# Patient Record
Sex: Female | Born: 1950 | Race: White | Hispanic: No | Marital: Married | Smoking: Never smoker
Health system: Southern US, Community
[De-identification: ages and names within clinical notes are randomized; demographics above are authoritative.]

## PROBLEM LIST (undated history)

## (undated) DIAGNOSIS — F32A Depression, unspecified: Secondary | ICD-10-CM

## (undated) DIAGNOSIS — K449 Diaphragmatic hernia without obstruction or gangrene: Secondary | ICD-10-CM

## (undated) DIAGNOSIS — M722 Plantar fascial fibromatosis: Secondary | ICD-10-CM

## (undated) DIAGNOSIS — R519 Headache, unspecified: Secondary | ICD-10-CM

## (undated) DIAGNOSIS — D131 Benign neoplasm of stomach: Secondary | ICD-10-CM

## (undated) DIAGNOSIS — R51 Headache: Secondary | ICD-10-CM

## (undated) DIAGNOSIS — M419 Scoliosis, unspecified: Secondary | ICD-10-CM

## (undated) DIAGNOSIS — G4700 Insomnia, unspecified: Secondary | ICD-10-CM

## (undated) DIAGNOSIS — E7800 Pure hypercholesterolemia, unspecified: Secondary | ICD-10-CM

## (undated) DIAGNOSIS — F329 Major depressive disorder, single episode, unspecified: Secondary | ICD-10-CM

## (undated) DIAGNOSIS — F419 Anxiety disorder, unspecified: Secondary | ICD-10-CM

## (undated) DIAGNOSIS — J45909 Unspecified asthma, uncomplicated: Secondary | ICD-10-CM

## (undated) DIAGNOSIS — K219 Gastro-esophageal reflux disease without esophagitis: Secondary | ICD-10-CM

## (undated) DIAGNOSIS — T7840XA Allergy, unspecified, initial encounter: Secondary | ICD-10-CM

## (undated) DIAGNOSIS — G4730 Sleep apnea, unspecified: Secondary | ICD-10-CM

## (undated) DIAGNOSIS — M8580 Other specified disorders of bone density and structure, unspecified site: Secondary | ICD-10-CM

## (undated) HISTORY — DX: Headache, unspecified: R51.9

## (undated) HISTORY — DX: Plantar fascial fibromatosis: M72.2

## (undated) HISTORY — PX: PLANTAR FASCIA SURGERY: SHX746

## (undated) HISTORY — DX: Pure hypercholesterolemia, unspecified: E78.00

## (undated) HISTORY — DX: Scoliosis, unspecified: M41.9

## (undated) HISTORY — DX: Benign neoplasm of stomach: D13.1

## (undated) HISTORY — DX: Depression, unspecified: F32.A

## (undated) HISTORY — DX: Headache: R51

## (undated) HISTORY — DX: Gastro-esophageal reflux disease without esophagitis: K21.9

## (undated) HISTORY — DX: Major depressive disorder, single episode, unspecified: F32.9

## (undated) HISTORY — PX: POLYPECTOMY: SHX149

## (undated) HISTORY — DX: Anxiety disorder, unspecified: F41.9

## (undated) HISTORY — DX: Diaphragmatic hernia without obstruction or gangrene: K44.9

## (undated) HISTORY — PX: COLONOSCOPY: SHX174

## (undated) HISTORY — DX: Allergy, unspecified, initial encounter: T78.40XA

## (undated) HISTORY — DX: Insomnia, unspecified: G47.00

## (undated) HISTORY — PX: SIGMOIDOSCOPY: SUR1295

## (undated) HISTORY — PX: OTHER SURGICAL HISTORY: SHX169

## (undated) HISTORY — DX: Other specified disorders of bone density and structure, unspecified site: M85.80

---
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
## 1984-12-30 HISTORY — PX: OTHER SURGICAL HISTORY: SHX169

## 1994-12-30 HISTORY — PX: MYOMECTOMY: SHX85

## 1998-02-12 ENCOUNTER — Inpatient Hospital Stay (HOSPITAL_COMMUNITY): Admission: AD | Admit: 1998-02-12 | Discharge: 1998-02-13 | Payer: Self-pay | Admitting: Obstetrics and Gynecology

## 1999-03-09 ENCOUNTER — Other Ambulatory Visit: Admission: RE | Admit: 1999-03-09 | Discharge: 1999-03-09 | Payer: Self-pay | Admitting: Obstetrics and Gynecology

## 2000-02-13 ENCOUNTER — Other Ambulatory Visit: Admission: RE | Admit: 2000-02-13 | Discharge: 2000-02-13 | Payer: Self-pay | Admitting: Gynecology

## 2000-05-12 ENCOUNTER — Encounter: Admission: RE | Admit: 2000-05-12 | Discharge: 2000-05-12 | Payer: Self-pay | Admitting: Gynecology

## 2000-05-12 ENCOUNTER — Encounter: Payer: Self-pay | Admitting: Gynecology

## 2001-03-04 ENCOUNTER — Other Ambulatory Visit: Admission: RE | Admit: 2001-03-04 | Discharge: 2001-03-04 | Payer: Self-pay | Admitting: Gynecology

## 2001-05-13 ENCOUNTER — Encounter: Payer: Self-pay | Admitting: Gynecology

## 2001-05-13 ENCOUNTER — Encounter: Admission: RE | Admit: 2001-05-13 | Discharge: 2001-05-13 | Payer: Self-pay | Admitting: Gynecology

## 2002-03-22 ENCOUNTER — Other Ambulatory Visit: Admission: RE | Admit: 2002-03-22 | Discharge: 2002-03-22 | Payer: Self-pay | Admitting: Gynecology

## 2002-05-17 ENCOUNTER — Encounter: Payer: Self-pay | Admitting: Internal Medicine

## 2002-05-17 ENCOUNTER — Encounter: Admission: RE | Admit: 2002-05-17 | Discharge: 2002-05-17 | Payer: Self-pay | Admitting: Gynecology

## 2002-05-17 ENCOUNTER — Encounter: Payer: Self-pay | Admitting: Gynecology

## 2002-05-17 ENCOUNTER — Encounter: Admission: RE | Admit: 2002-05-17 | Discharge: 2002-05-17 | Payer: Self-pay | Admitting: Internal Medicine

## 2002-11-09 ENCOUNTER — Ambulatory Visit (HOSPITAL_COMMUNITY): Admission: RE | Admit: 2002-11-09 | Discharge: 2002-11-09 | Payer: Self-pay | Admitting: Gastroenterology

## 2003-03-23 ENCOUNTER — Other Ambulatory Visit: Admission: RE | Admit: 2003-03-23 | Discharge: 2003-03-23 | Payer: Self-pay | Admitting: Gynecology

## 2003-05-20 ENCOUNTER — Encounter: Payer: Self-pay | Admitting: Gynecology

## 2003-05-20 ENCOUNTER — Encounter: Admission: RE | Admit: 2003-05-20 | Discharge: 2003-05-20 | Payer: Self-pay | Admitting: Gynecology

## 2004-02-23 ENCOUNTER — Other Ambulatory Visit: Admission: RE | Admit: 2004-02-23 | Discharge: 2004-02-23 | Payer: Self-pay | Admitting: *Deleted

## 2004-05-21 ENCOUNTER — Encounter: Admission: RE | Admit: 2004-05-21 | Discharge: 2004-05-21 | Payer: Self-pay | Admitting: *Deleted

## 2004-11-12 ENCOUNTER — Ambulatory Visit (HOSPITAL_COMMUNITY): Admission: RE | Admit: 2004-11-12 | Discharge: 2004-11-12 | Payer: Self-pay | Admitting: Internal Medicine

## 2005-06-21 ENCOUNTER — Encounter: Admission: RE | Admit: 2005-06-21 | Discharge: 2005-06-21 | Payer: Self-pay | Admitting: *Deleted

## 2005-06-21 ENCOUNTER — Other Ambulatory Visit: Admission: RE | Admit: 2005-06-21 | Discharge: 2005-06-21 | Payer: Self-pay | Admitting: *Deleted

## 2005-10-11 ENCOUNTER — Ambulatory Visit: Payer: Self-pay | Admitting: Gastroenterology

## 2005-10-14 ENCOUNTER — Ambulatory Visit: Payer: Self-pay | Admitting: Internal Medicine

## 2005-12-03 ENCOUNTER — Ambulatory Visit: Payer: Self-pay | Admitting: Internal Medicine

## 2006-03-04 ENCOUNTER — Ambulatory Visit: Payer: Self-pay | Admitting: Internal Medicine

## 2006-06-25 ENCOUNTER — Encounter: Admission: RE | Admit: 2006-06-25 | Discharge: 2006-06-25 | Payer: Self-pay | Admitting: Internal Medicine

## 2007-06-26 ENCOUNTER — Encounter: Admission: RE | Admit: 2007-06-26 | Discharge: 2007-06-26 | Payer: Self-pay | Admitting: Obstetrics and Gynecology

## 2008-06-27 ENCOUNTER — Encounter: Admission: RE | Admit: 2008-06-27 | Discharge: 2008-06-27 | Payer: Self-pay | Admitting: Obstetrics and Gynecology

## 2009-06-26 ENCOUNTER — Encounter: Admission: RE | Admit: 2009-06-26 | Discharge: 2009-06-26 | Payer: Self-pay | Admitting: Internal Medicine

## 2010-06-07 ENCOUNTER — Encounter: Admission: RE | Admit: 2010-06-07 | Discharge: 2010-06-07 | Payer: Self-pay | Admitting: Obstetrics and Gynecology

## 2011-05-31 ENCOUNTER — Other Ambulatory Visit: Payer: Self-pay | Admitting: Internal Medicine

## 2011-05-31 DIAGNOSIS — Z1231 Encounter for screening mammogram for malignant neoplasm of breast: Secondary | ICD-10-CM

## 2011-06-28 ENCOUNTER — Ambulatory Visit
Admission: RE | Admit: 2011-06-28 | Discharge: 2011-06-28 | Disposition: A | Discharge status: Home / Self Care | Payer: BC Managed Care – PPO | Source: Ambulatory Visit | Attending: Internal Medicine | Admitting: Internal Medicine

## 2011-06-28 DIAGNOSIS — Z1231 Encounter for screening mammogram for malignant neoplasm of breast: Secondary | ICD-10-CM

## 2012-05-19 ENCOUNTER — Other Ambulatory Visit: Payer: Self-pay | Admitting: Obstetrics and Gynecology

## 2012-05-19 DIAGNOSIS — Z1231 Encounter for screening mammogram for malignant neoplasm of breast: Secondary | ICD-10-CM

## 2012-06-10 ENCOUNTER — Ambulatory Visit
Admission: RE | Admit: 2012-06-10 | Discharge: 2012-06-10 | Disposition: A | Discharge status: Home / Self Care | Payer: BC Managed Care – PPO | Source: Ambulatory Visit | Attending: Obstetrics and Gynecology | Admitting: Obstetrics and Gynecology

## 2012-06-10 DIAGNOSIS — Z1231 Encounter for screening mammogram for malignant neoplasm of breast: Secondary | ICD-10-CM

## 2013-05-12 ENCOUNTER — Other Ambulatory Visit: Payer: Self-pay

## 2013-05-12 DIAGNOSIS — Z1231 Encounter for screening mammogram for malignant neoplasm of breast: Secondary | ICD-10-CM

## 2013-06-23 ENCOUNTER — Ambulatory Visit
Admission: RE | Admit: 2013-06-23 | Discharge: 2013-06-23 | Disposition: A | Discharge status: Home / Self Care | Payer: BC Managed Care – PPO | Source: Ambulatory Visit

## 2013-06-23 DIAGNOSIS — Z1231 Encounter for screening mammogram for malignant neoplasm of breast: Secondary | ICD-10-CM

## 2014-01-11 ENCOUNTER — Other Ambulatory Visit: Payer: Self-pay | Admitting: Gastroenterology

## 2014-01-11 DIAGNOSIS — Z1211 Encounter for screening for malignant neoplasm of colon: Secondary | ICD-10-CM

## 2014-01-17 ENCOUNTER — Other Ambulatory Visit: Payer: BC Managed Care – PPO

## 2014-05-16 ENCOUNTER — Other Ambulatory Visit: Payer: Self-pay

## 2014-05-16 DIAGNOSIS — Z1231 Encounter for screening mammogram for malignant neoplasm of breast: Secondary | ICD-10-CM

## 2014-06-08 ENCOUNTER — Encounter (INDEPENDENT_AMBULATORY_CARE_PROVIDER_SITE_OTHER): Payer: Self-pay

## 2014-06-08 ENCOUNTER — Ambulatory Visit
Admission: RE | Admit: 2014-06-08 | Discharge: 2014-06-08 | Disposition: A | Discharge status: Home / Self Care | Payer: BC Managed Care – PPO | Source: Ambulatory Visit

## 2014-06-08 DIAGNOSIS — Z1231 Encounter for screening mammogram for malignant neoplasm of breast: Secondary | ICD-10-CM

## 2014-11-07 ENCOUNTER — Ambulatory Visit (INDEPENDENT_AMBULATORY_CARE_PROVIDER_SITE_OTHER): Payer: BC Managed Care – PPO | Admitting: Internal Medicine

## 2014-11-07 ENCOUNTER — Encounter: Payer: Self-pay | Admitting: Internal Medicine

## 2014-11-07 VITALS — BP 118/82 | HR 83 | Ht 66.0 in | Wt 170.0 lb

## 2014-11-07 DIAGNOSIS — J309 Allergic rhinitis, unspecified: Secondary | ICD-10-CM

## 2014-11-07 DIAGNOSIS — J302 Other seasonal allergic rhinitis: Secondary | ICD-10-CM

## 2014-11-07 DIAGNOSIS — J3089 Other allergic rhinitis: Secondary | ICD-10-CM

## 2014-11-07 DIAGNOSIS — J45909 Unspecified asthma, uncomplicated: Secondary | ICD-10-CM

## 2014-11-07 DIAGNOSIS — Z23 Encounter for immunization: Secondary | ICD-10-CM

## 2014-11-07 MED ORDER — MONTELUKAST SODIUM 10 MG PO TABS: 10.0000 mg | ORAL_TABLET | Freq: Every day | ORAL | Status: DC

## 2014-11-07 NOTE — Patient Instructions (Addendum)
Pneumovax-23  Script to start Singulair/ montelukast  sent

## 2014-11-07 NOTE — Progress Notes (Addendum)
11/07/14-  18 yoF Former patient at [facility] and allergies coming to re-establish with complaints of shortness of breath and cough. Remote allergy skin testing after which she has stayed on Nasonex, which controls sneeze. History of asthma, intermittent. Mild cough "never quite a cold". Winter colds tend to turn and prolonged bronchitis and sinusitis for which she often needs prolonged steroids tapering over several weeks. Occasional wheeze lying down. Some shortness of breath with exertion and nonproductive cough. Otherwise generally good health. Has had flu shot. 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  Prior to Admission medications   Medication Sig Start Date End Date Taking? Authorizing Provider  albuterol (PROVENTIL HFA;VENTOLIN HFA) 108 (90 BASE) MCG/ACT inhaler Inhale 2 puffs into the lungs every 6 (six) hours as needed for wheezing or shortness of breath.   Yes Historical Provider, MD  Cholecalciferol (VITAMIN D) 400 UNITS capsule Take 5,000 Units by mouth every other day.   Yes Historical Provider, MD  esomeprazole (NEXIUM) 40 MG capsule Take 1 capsule by mouth daily.   Yes Historical Provider, MD  mometasone (NASONEX) 50 MCG/ACT nasal spray Place 2 sprays into the nose daily.   Yes Historical Provider, MD  Multiple Vitamins-Minerals (MULTIVITAMIN PO) Take 1 tablet by mouth daily.   Yes Historical Provider, MD  traZODone (DESYREL) 50 MG tablet Take 50 mg by mouth at bedtime as needed.   Yes Historical Provider, MD  venlafaxine XR (EFFEXOR-XR) 75 MG 24 hr capsule Take 1 capsule by mouth daily. 09/14/14  Yes Historical Provider, MD  montelukast (SINGULAIR) 10 MG tablet Take 1 tablet (10 mg total) by mouth at bedtime. 11/07/14   Misty Lever, MD   Past Medical History  Diagnosis Date  . Allergy    Past Surgical History  Procedure Laterality Date  . Myomectomy  1996  . Plantar fascia surgery     Family History  Problem Relation Age of Onset  . Heart  disease Father   . Rheum arthritis Father   . Lupus Father   . Stroke Maternal Grandfather   . Dementia Mother   . Hypertension Mother   . Stroke Maternal Grandfather    History   Social History  . Marital Status: Married    Spouse Name: N/A    Number of Children: 0  . Years of Education: N/A   Occupational History  . non Airline pilot    Social History Main Topics  . Smoking status: Never Smoker   . Smokeless tobacco: Not on file  . Alcohol Use: 0.0 oz/week    0 Not specified per week     Comment: 1-2 glasses wine a week  . Drug Use: No  . Sexual Activity: Not on file   Other Topics Concern  . Not on file   Social History Narrative  . No narrative on file   ROS-see HPI Constitutional:   No-   weight loss, night sweats, fevers, chills, fatigue, lassitude. HEENT:   No-  headaches, difficulty swallowing, tooth/dental problems, sore throat,       +sneezing, itching, ear ache, +nasal congestion, post nasal drip,  CV:  No-   chest pain, orthopnea, PND, swelling in lower extremities, anasarca,                                  dizziness, palpitations Resp: + shortness of breath with exertion or at rest.  No-   productive cough,  + non-productive cough,  No- coughing up of blood.              No-   change in color of mucus.  No- wheezing.   Skin: No-   rash or lesions. GI:  No-   heartburn, indigestion, abdominal pain, nausea, vomiting, diarrhea,                 change in bowel habits, loss of appetite GU: No-   dysuria, change in color of urine, no urgency or frequency.  No- flank pain. MS:  No-   joint pain or swelling.  No- decreased range of motion.  No- back pain. Neuro-     nothing unusual Psych:  No- change in mood or affect. No depression or anxiety.  No memory loss.  OBJ- Physical Exam General- Alert, Oriented, Affect-appropriate, Distress- none acute Skin- rash-none, lesions- none, excoriation- none Lymphadenopathy- none Head- atraumatic             Eyes- Gross vision intact, PERRLA, conjunctivae and secretions clear            Ears- Hearing, canals-normal            Nose- + minor turbinate edema, no-Septal dev, mucus, polyps, erosion,                        perforation             Throat- Mallampati II , mucosa clear , drainage- none, tonsils- atrophic Neck- flexible , trachea midline, no stridor , thyroid nl, carotid no bruit Chest - symmetrical excursion , unlabored           Heart/CV- RRR , no murmur , no gallop  , no rub, nl s1 s2                           - JVD- none , edema- none, stasis changes- none, varices- none           Lung- clear to P&A, wheeze- none, cough- none , dullness-none, rub- none           Chest wall-  Abd- tender-no, distended-no, bowel sounds-present, HSM- no Br/ Gen/ Rectal- Not done, not indicated Extrem- cyanosis- none, clubbing, none, atrophy- none, strength- nl, + boot on left                   foot for plantar fasciitis Neuro- grossly intact to observation

## 2014-11-13 DIAGNOSIS — J302 Other seasonal allergic rhinitis: Secondary | ICD-10-CM | POA: Insufficient documentation

## 2014-11-13 DIAGNOSIS — J45909 Unspecified asthma, uncomplicated: Secondary | ICD-10-CM | POA: Insufficient documentation

## 2014-11-13 DIAGNOSIS — J3089 Other allergic rhinitis: Secondary | ICD-10-CM

## 2014-11-13 NOTE — Assessment & Plan Note (Signed)
Nasal steroid spray and occasional antihistamine have been sufficient

## 2014-11-13 NOTE — Assessment & Plan Note (Signed)
Fair control but it may help to add Singulair as discussed Plan-pneumonia vaccine, Singulair trial

## 2015-01-06 ENCOUNTER — Telehealth: Payer: Self-pay | Admitting: Internal Medicine

## 2015-01-06 MED ORDER — PROMETHAZINE-CODEINE 6.25-10 MG/5ML PO SYRP: 5.0000 mL | ORAL_SOLUTION | Freq: Four times a day (QID) | ORAL | Status: DC | PRN

## 2015-01-06 NOTE — Telephone Encounter (Signed)
Pt aware of rec's per CY.  Cough syrup called into pharmacy .  Nothing further needed.

## 2015-01-06 NOTE — Telephone Encounter (Signed)
We are seeing a lot of viral bronchitis like this now. Nothing to suggest antibiotic would help for now. We can re-think that later if needed.  For now mostly treat with fluids and rest and simple comfort meds. We can send prometh codeine cough syrup if she wants- 140 ml, 5 ml every 6 hours if needed for cough

## 2015-01-06 NOTE — Telephone Encounter (Signed)
Called and spoke to pt. Pt c/o increase in dry cough with slight chest discomfort secondary to cough x 2 days. Pt stated she is trying to avoid the use of pred so she is requesting recs by CY. Pt denies SOB, f/c/s, chest congestion.   Dr. Annamaria Boots please advise.   No Known Allergies

## 2015-02-07 ENCOUNTER — Ambulatory Visit: Payer: BC Managed Care – PPO | Admitting: Internal Medicine

## 2015-02-08 ENCOUNTER — Telehealth: Payer: Self-pay | Admitting: Internal Medicine

## 2015-02-08 MED ORDER — MONTELUKAST SODIUM 10 MG PO TABS: 10.0000 mg | ORAL_TABLET | Freq: Every day | ORAL | Status: DC

## 2015-02-08 NOTE — Telephone Encounter (Signed)
Spoke with pt and advised that refill for Singulair was sent to pharmacy.

## 2015-02-16 ENCOUNTER — Encounter: Payer: Self-pay | Admitting: Internal Medicine

## 2015-02-16 ENCOUNTER — Ambulatory Visit: Payer: Self-pay | Admitting: Internal Medicine

## 2015-02-16 ENCOUNTER — Ambulatory Visit (INDEPENDENT_AMBULATORY_CARE_PROVIDER_SITE_OTHER): Payer: BLUE CROSS/BLUE SHIELD | Admitting: Internal Medicine

## 2015-02-16 VITALS — BP 128/62 | HR 83 | Ht 66.0 in | Wt 181.5 lb

## 2015-02-16 DIAGNOSIS — J3089 Other allergic rhinitis: Principal | ICD-10-CM

## 2015-02-16 DIAGNOSIS — J309 Allergic rhinitis, unspecified: Secondary | ICD-10-CM

## 2015-02-16 DIAGNOSIS — J45909 Unspecified asthma, uncomplicated: Secondary | ICD-10-CM

## 2015-02-16 DIAGNOSIS — J302 Other seasonal allergic rhinitis: Secondary | ICD-10-CM

## 2015-02-16 NOTE — Patient Instructions (Signed)
We can continue present meds.  You can ask your PCP about changing your long term use of your PPI acid blocker

## 2015-02-16 NOTE — Progress Notes (Signed)
11/07/14-  26 yoF Former patient at [facility] and allergies coming to re-establish with complaints of shortness of breath and cough. Remote allergy skin testing after which she has stayed on Nasonex, which controls sneeze. History of asthma, intermittent. Mild cough "never quite a cold". Winter colds tend to turn and prolonged bronchitis and sinusitis for which she often needs prolonged steroids tapering over several weeks. Occasional wheeze lying down. Some shortness of breath with exertion and nonproductive cough. Otherwise generally good health. Has had flu shot. 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  02/16/15- 33 yoF Former patient at [facility] and allergies coming to re-establish with complaints of shortness of breath and cough. Remote allergy skin testing after which she has stayed on Nasonex, which controls sneeze. FOLLOWS FOR:  pt stated that asthma and allergies are under control at this time. No major respiratory events since last here. Reflux has been controlled by Nexium. We discussed recent concerns published about long-term use of PPIs.  ROS-see HPI Constitutional:   No-   weight loss, night sweats, fevers, chills, fatigue, lassitude. HEENT:   No-  headaches, difficulty swallowing, tooth/dental problems, sore throat,       No-sneezing, itching, ear ache, +nasal congestion, post nasal drip,  CV:  No-   chest pain, orthopnea, PND, swelling in lower extremities, anasarca,                                  dizziness, palpitations Resp: No-shortness of breath with exertion or at rest.              No-   productive cough,  + non-productive cough,  No- coughing up of blood.              No-   change in color of mucus.  No- wheezing.   Skin: No-   rash or lesions. GI:  No-   heartburn, indigestion, abdominal pain, nausea, vomiting,  GU:  MS:  No-   joint pain or swelling.   Neuro-     nothing unusual Psych:  No- change in mood or affect. No  depression or anxiety.  No memory loss.  OBJ- Physical Exam General- Alert, Oriented, Affect-appropriate, Distress- none acute Skin- rash-none, lesions- none, excoriation- none Lymphadenopathy- none Head- atraumatic            Eyes- Gross vision intact, PERRLA, conjunctivae and secretions clear            Ears- Hearing, canals-normal            Nose- + minor turbinate edema, no-Septal dev, mucus, polyps, erosion,                        perforation             Throat- Mallampati II , mucosa clear , drainage- none, tonsils- atrophic Neck- flexible , trachea midline, no stridor , thyroid nl, carotid no bruit Chest - symmetrical excursion , unlabored           Heart/CV- RRR , no murmur , no gallop  , no rub, nl s1 s2                           - JVD- none , edema- none, stasis changes- none, varices- none  Lung- clear to P&A, wheeze- none, cough- none , dullness-none, rub- none           Chest wall-  Abd-  Br/ Gen/ Rectal- Not done, not indicated Extrem- cyanosis- none, clubbing, none, atrophy- none, strength- nl,  Neuro- grossly intact to observation

## 2015-02-17 NOTE — Assessment & Plan Note (Signed)
Mild intermittent uncomplicated 

## 2015-02-17 NOTE — Assessment & Plan Note (Signed)
Adequate control with Nasonex for this time of year. Spring pollens are pending and she'll add antihistamine if needed. Invited to return as needed.

## 2015-05-09 ENCOUNTER — Other Ambulatory Visit: Payer: Self-pay

## 2015-05-09 DIAGNOSIS — Z1231 Encounter for screening mammogram for malignant neoplasm of breast: Secondary | ICD-10-CM

## 2015-05-31 ENCOUNTER — Ambulatory Visit
Admission: RE | Admit: 2015-05-31 | Discharge: 2015-05-31 | Disposition: A | Discharge status: Home / Self Care | Payer: BLUE CROSS/BLUE SHIELD | Source: Ambulatory Visit

## 2015-05-31 DIAGNOSIS — Z1231 Encounter for screening mammogram for malignant neoplasm of breast: Secondary | ICD-10-CM

## 2015-11-17 ENCOUNTER — Telehealth: Payer: Self-pay | Admitting: Internal Medicine

## 2015-11-17 NOTE — Telephone Encounter (Signed)
Pt was seen by PCP a little over a week ago when her sinus issues began and was placed on Amoxicillin 10 day course - completed 11/15/15. Pt c/o hacky cough and sinus congestion. Pt denies fever, mucus production or drainage. States that she had a fever at the beginning of her sinus infection but this has improved.  Pt reports having increased chest tightness and SOB at night. Using Singulair and Proair as directed. Pt states that she was also given a Pred Rx but has not filled this yet, wanted to check with Dr Annamaria Boots before taking his. Pt states that this Rx was written for 20mg  tablets to take for 10 days -- pt unsure the exact instructions.  Please advise Dr Annamaria Boots if any other treatment options or if patient needs to take Pred Rx as prescribed by PCP.   No Known Allergies     Medication List       This list is accurate as of: 11/17/15  9:55 AM.  Always use your most recent med list.               albuterol 108 (90 BASE) MCG/ACT inhaler  Commonly known as:  PROVENTIL HFA;VENTOLIN HFA  Inhale 2 puffs into the lungs every 6 (six) hours as needed for wheezing or shortness of breath.     esomeprazole 40 MG capsule  Commonly known as:  NEXIUM  Take 1 capsule by mouth daily.     montelukast 10 MG tablet  Commonly known as:  SINGULAIR  Take 1 tablet (10 mg total) by mouth at bedtime.     MULTIVITAMIN PO  Take 1 tablet by mouth daily.     traZODone 50 MG tablet  Commonly known as:  DESYREL  Take 50 mg by mouth at bedtime as needed.     venlafaxine XR 75 MG 24 hr capsule  Commonly known as:  EFFEXOR-XR  Take 1 capsule by mouth daily.     Vitamin D 400 UNITS capsule  Take 5,000 Units by mouth every other day.

## 2015-11-17 NOTE — Telephone Encounter (Signed)
Patient notified of Dr. Janee Morn recommendations. Patient states she already has prednisone and does not need a new RX sent in, she said she will just take it as Dr. Annamaria Boots directed Nothing further needed. Closing encounter

## 2015-11-17 NOTE — Telephone Encounter (Signed)
Since she has completed antibiotic and does have prednisone, suggest;    Take prednisone 20 mg daily x 3 days, then 1/2 tab = 10 mg x 3 days, then see if she can stop.

## 2015-11-21 ENCOUNTER — Telehealth: Payer: Self-pay | Admitting: Internal Medicine

## 2015-11-21 MED ORDER — DOXYCYCLINE HYCLATE 100 MG PO TABS: ORAL_TABLET | ORAL | Status: DC

## 2015-11-21 NOTE — Telephone Encounter (Signed)
Patient notified of Dr. Janee Morn recommendations. Patient declined Best boy as she is unable to swallow those pills and says she will take Delsym instead. Rx for Doxy sent to pharmacy. Nothing further needed. Closing encounter

## 2015-11-21 NOTE — Telephone Encounter (Signed)
[name], MD at 11/17/2015 12:20 PM     Status: Signed       Expand All Collapse All   Since she has completed antibiotic and does have prednisone, suggest; Take prednisone 20 mg daily x 3 days, then 1/2 tab = 10 mg x 3 days, then see if she can stop.        Spoke with pt. Reports that she is still coughing, chest congestion and sinus congestion. SOB and chest tightness are also present. Denies fever. Wants something else called in.  No Known Allergies Current Outpatient Prescriptions on File Prior to Visit  Medication Sig Dispense Refill  . albuterol (PROVENTIL HFA;VENTOLIN HFA) 108 (90 BASE) MCG/ACT inhaler Inhale 2 puffs into the lungs every 6 (six) hours as needed for wheezing or shortness of breath.    . Cholecalciferol (VITAMIN D) 400 UNITS capsule Take 5,000 Units by mouth every other day.    . esomeprazole (NEXIUM) 40 MG capsule Take 1 capsule by mouth daily.    . montelukast (SINGULAIR) 10 MG tablet Take 1 tablet (10 mg total) by mouth at bedtime. 90 tablet 2  . Multiple Vitamins-Minerals (MULTIVITAMIN PO) Take 1 tablet by mouth daily.    . traZODone (DESYREL) 50 MG tablet Take 50 mg by mouth at bedtime as needed.    . venlafaxine XR (EFFEXOR-XR) 75 MG 24 hr capsule Take 1 capsule by mouth daily.  0   No current facility-administered medications on file prior to visit.   CY - please advise. Thanks.

## 2015-11-21 NOTE — Telephone Encounter (Signed)
Suggest change of antibiotic now to doxycycline 100 mg, # 8, 2 today then one daily  Also offer benzonatate perles 200 mg, # 30, 1 every 8 hours as needed for cough

## 2016-04-24 ENCOUNTER — Other Ambulatory Visit: Payer: Self-pay

## 2016-04-24 DIAGNOSIS — Z1231 Encounter for screening mammogram for malignant neoplasm of breast: Secondary | ICD-10-CM

## 2016-06-13 ENCOUNTER — Ambulatory Visit: Payer: BLUE CROSS/BLUE SHIELD

## 2016-06-13 ENCOUNTER — Other Ambulatory Visit: Payer: Self-pay | Admitting: Obstetrics and Gynecology

## 2016-06-13 ENCOUNTER — Ambulatory Visit
Admission: RE | Admit: 2016-06-13 | Discharge: 2016-06-13 | Disposition: A | Discharge status: Home / Self Care | Payer: BLUE CROSS/BLUE SHIELD | Source: Ambulatory Visit

## 2016-06-13 DIAGNOSIS — Z1231 Encounter for screening mammogram for malignant neoplasm of breast: Secondary | ICD-10-CM

## 2016-06-17 ENCOUNTER — Encounter (INDEPENDENT_AMBULATORY_CARE_PROVIDER_SITE_OTHER): Payer: BLUE CROSS/BLUE SHIELD | Admitting: Ophthalmology

## 2016-06-17 DIAGNOSIS — H43813 Vitreous degeneration, bilateral: Secondary | ICD-10-CM | POA: Diagnosis not present

## 2016-06-17 DIAGNOSIS — H2513 Age-related nuclear cataract, bilateral: Secondary | ICD-10-CM

## 2016-08-02 ENCOUNTER — Telehealth: Payer: Self-pay | Admitting: *Deleted

## 2016-08-02 NOTE — Telephone Encounter (Addendum)
Pt states she is looking for information for EPAT, such as timing to fit into her schedule.

## 2016-10-02 ENCOUNTER — Ambulatory Visit (INDEPENDENT_AMBULATORY_CARE_PROVIDER_SITE_OTHER): Payer: BLUE CROSS/BLUE SHIELD

## 2016-10-02 ENCOUNTER — Encounter: Payer: Self-pay | Admitting: Podiatry

## 2016-10-02 ENCOUNTER — Ambulatory Visit (INDEPENDENT_AMBULATORY_CARE_PROVIDER_SITE_OTHER): Payer: BLUE CROSS/BLUE SHIELD | Admitting: Podiatry

## 2016-10-02 VITALS — BP 130/78 | HR 66 | Resp 16 | Ht 66.0 in | Wt 180.0 lb

## 2016-10-02 DIAGNOSIS — M722 Plantar fascial fibromatosis: Secondary | ICD-10-CM | POA: Diagnosis not present

## 2016-10-02 DIAGNOSIS — M79672 Pain in left foot: Secondary | ICD-10-CM

## 2016-10-02 NOTE — Progress Notes (Signed)
   Subjective:    Patient ID: Misty [name], female    DOB: 01-May-1951, 65 y.o.   MRN: [id number]  HPI  Chief Complaint  Patient presents with  . Foot Pain    Left heel pain 'heels like a stone bruise" x 2.5 yrs on and off.        Review of Systems  All other systems reviewed and are negative.      Objective:   Physical Exam        Assessment & Plan:

## 2016-10-03 NOTE — Progress Notes (Signed)
Subjective:     Patient ID: Misty [name], female   DOB: June 27, 1951, 65 y.o.   MRN: [id number]  HPI patient presents stating that her left heel has been hurting for around 2-1/2 years and she has had previous injections casting anti-inflammatories and other treatments without relief and the symptoms continue to progress   Review of Systems  All other systems reviewed and are negative.      Objective:   Physical Exam  Constitutional: She is oriented to person, place, and time.  Cardiovascular: Intact distal pulses.   Musculoskeletal: Normal range of motion.  Neurological: She is oriented to person, place, and time.  Skin: Skin is warm.  Nursing note and vitals reviewed.  neurovascular status found to be intact muscle strength was found to be normal with mild equinus condition. Patient is found to have exquisite discomfort in the left plantar fashion at the insertional point of the tendon into the calcaneus with mild flatfoot deformity but no obvious chemical dysfunction. There is good digital perfusion and patient well oriented 3     Assessment:     Chronic plantar fasciitis left with inflammation and fluid around the medial band    Plan:     Discussed condition and x-rayed foot. At this point we have focused on shockwave therapy over surgery and I did discuss ultimate endoscopic surgery if it does not improve. Patient is scheduled for shockwave therapy at this time and also was dispensed night splint with all instructions on usage along with stretching exercises. Patient is scheduled to begin treatment in the next few days and went over the possibility that this would not work and require surgery  X-ray report indicated spur on the medial heel with no other signs of pathology

## 2016-10-04 ENCOUNTER — Telehealth: Payer: Self-pay | Admitting: *Deleted

## 2016-10-04 NOTE — Telephone Encounter (Signed)
Pt states the night splint is too bulky to sleep in, is there anything smaller.  I left message informing pt we would rather she got a good night sleep, so wear the night splint when she was resting during the day or evening and that would help train the plantar fascia and achilles to stay stretch-able.

## 2016-10-29 ENCOUNTER — Ambulatory Visit (INDEPENDENT_AMBULATORY_CARE_PROVIDER_SITE_OTHER): Payer: BLUE CROSS/BLUE SHIELD

## 2016-10-29 DIAGNOSIS — M722 Plantar fascial fibromatosis: Secondary | ICD-10-CM

## 2016-10-29 NOTE — Progress Notes (Signed)
   Subjective:    Patient ID: Misty [name], female    DOB: [birth year], 65 y.o.   MRN: [id number]  HPI  Pt presents with pain in the left heel, ongoing for several months  Review of Systems    All other systems negative Objective:   Physical Exam Pain on palpation of left medial heel band, 5 of 10         Assessment & Plan:  ESWT administered to Lt medial heel band at 18 joules for 3000 pulses and tolerated well. EPAT therapy administered to surrounding connective tissue for 3000 pulses. Advised on boot usage and avoiding NSAIDS and ice. Re-appointed in 1 week for 2nd treatment

## 2016-11-05 ENCOUNTER — Ambulatory Visit: Payer: BLUE CROSS/BLUE SHIELD

## 2016-11-05 DIAGNOSIS — M722 Plantar fascial fibromatosis: Secondary | ICD-10-CM

## 2016-11-05 DIAGNOSIS — M79672 Pain in left foot: Secondary | ICD-10-CM

## 2016-11-05 NOTE — Progress Notes (Signed)
   Subjective:    Patient ID: Misty [name], female    DOB: 1951/10/23, 65 y.o.   MRN: [id number]  HPI  Pt presents with pain in the left heel, ongoing for several months, but she has noticed an improvement in the intensity of pain since her last visit  Review of Systems    All other systems negative Objective:   Physical Exam No pain on palpation on medial heel band         Assessment & Plan:  ESWT administered to Lt medial heel band at 23 joules for 3000 pulses and tolerated well. EPAT therapy administered to surrounding connective tissue for 3000 pulses. Advised on boot usage and avoiding NSAIDS and ice. Re-appointed in 1 week for 3rd treatment

## 2016-11-11 ENCOUNTER — Ambulatory Visit (INDEPENDENT_AMBULATORY_CARE_PROVIDER_SITE_OTHER): Payer: BLUE CROSS/BLUE SHIELD

## 2016-11-11 DIAGNOSIS — M722 Plantar fascial fibromatosis: Secondary | ICD-10-CM

## 2016-11-12 ENCOUNTER — Other Ambulatory Visit: Payer: BLUE CROSS/BLUE SHIELD

## 2016-11-13 ENCOUNTER — Other Ambulatory Visit: Payer: BLUE CROSS/BLUE SHIELD

## 2016-11-13 NOTE — Progress Notes (Signed)
   Subjective:    Patient ID: Misty [name], female    DOB: 1951-07-20, 65 y.o.   MRN: [id number]  HPI  Pt presents with pain in the left heel, ongoing for several months, but she has noticed an improvement in the intensity of pain since her last visit  Review of Systems    All other systems negative Objective:   Physical Exam No pain on palpation on medial heel band         Assessment & Plan:  ESWT administered to Lt medial heel band at 28 joules for 3000 pulses and tolerated well. EPAT therapy administered to surrounding connective tissue for 3000 pulses. Advised to follow up in 1 month for 4th treatment

## 2016-12-09 ENCOUNTER — Encounter: Payer: Self-pay | Admitting: Podiatry

## 2016-12-09 ENCOUNTER — Ambulatory Visit (INDEPENDENT_AMBULATORY_CARE_PROVIDER_SITE_OTHER): Payer: BLUE CROSS/BLUE SHIELD | Admitting: Podiatry

## 2016-12-09 DIAGNOSIS — M722 Plantar fascial fibromatosis: Secondary | ICD-10-CM

## 2016-12-10 NOTE — Progress Notes (Signed)
Subjective:     Patient ID: Misty [name], female   DOB: July 16, 1951, 65 y.o.   MRN: [id number]  HPI patient presents stating she is moderately improved but still having pain   Review of Systems     Objective:   Physical Exam Neurovascular status intact with continued discomfort in the plantar heel right with moderate improvement but still painful    Assessment:     Plantar fasciitis right still painful but improved    Plan:     Shockwave therapy administered approximate 30 J tolerated well and reappoint to recheck

## 2017-05-09 ENCOUNTER — Other Ambulatory Visit: Payer: Self-pay | Admitting: Obstetrics and Gynecology

## 2017-05-09 DIAGNOSIS — Z1231 Encounter for screening mammogram for malignant neoplasm of breast: Secondary | ICD-10-CM

## 2017-06-06 ENCOUNTER — Ambulatory Visit
Admission: RE | Admit: 2017-06-06 | Discharge: 2017-06-06 | Disposition: A | Discharge status: Home / Self Care | Payer: BLUE CROSS/BLUE SHIELD | Source: Ambulatory Visit | Attending: Obstetrics and Gynecology | Admitting: Obstetrics and Gynecology

## 2017-06-06 DIAGNOSIS — Z1231 Encounter for screening mammogram for malignant neoplasm of breast: Secondary | ICD-10-CM

## 2018-03-19 ENCOUNTER — Ambulatory Visit: Payer: BLUE CROSS/BLUE SHIELD | Admitting: Sports Medicine

## 2018-03-24 ENCOUNTER — Ambulatory Visit: Payer: BLUE CROSS/BLUE SHIELD | Admitting: Sports Medicine

## 2018-03-24 VITALS — BP 130/80 | Ht 66.5 in | Wt 165.0 lb

## 2018-03-24 DIAGNOSIS — M722 Plantar fascial fibromatosis: Secondary | ICD-10-CM | POA: Diagnosis not present

## 2018-03-24 NOTE — Progress Notes (Addendum)
[facility] Progress Note  Subjective:  Misty [name] is a 67 y.o. female with history of scoliosis, L patellar fracture after trauma, and ongoing left plantar fasciitis who presents for continued left heel pain. Symptoms began about 4 years ago and have been occurring on and off since then.  Initially pain was so severe that she needed to wear a boot.  She has seen podiatry and orthopedics.  She has had at least 3 ultrasound treatments through [facility], which she felt has been the most successful treatment so far with pain relief up to 4 months.  She has done heel stretching with some improvement.  She has had injections without much relief.  She had to wear a splint and keep off her left leg for 9 weeks last summer after fracturing her patella.  However pain returned even after rest.  She has to limit walking due to pain.  She does work from home so she is able to stay off her feet when needed.  She has an upcoming vacation and is hoping to be able to walk longer distances with less discomfort.  She has bought various types of shoes in hopes of finding a more comfortable option.  She has not yet tried custom orthotics.  ROS: No falls, no numbnes/ tingling/weakness of lower extremities  No Known Allergies  Social History   Tobacco Use  . Smoking status: Never Smoker  Substance Use Topics  . Alcohol use: Yes    Alcohol/week: 0.0 oz    Comment: 1-2 glasses wine a week    Objective: Blood pressure 130/80, height 5' 6.5" (1.689 m), weight 165 lb (74.8 kg). Body mass index is 26.23 kg/m. 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  Mild tenderness to palpation over medial left calcaneus.  No tenderness to palpation over Achilles.  Normal range of motion at ankle--no increased ROM with drawer testing.  Neurological: AOx3, no focal deficits.  Normal strength with dorsi and plantar flexion.  2+  patellar reflexes bilaterally. Skin: Skin is warm and dry. No rash noted.  Psychiatric: Normal mood and affect.  Vitals reviewed  Assessment/Plan: Plantar fasciitis of left foot - Chronic with waxing and waning severity. - Provided green insoles with scaphoid pads to try in shoes. Would recommend trying custom orthotics thereafter. - Would consider high frequency ultrasound in future if other conservative measures fail given chronicity of problem.  Follow-up prn for custom orthotics if trial of green sole inserts helps.   Olene Floss, MD [place], PGY-3  Patient seen and evaluated with the resident. I agree with the above plan of care. This is a challenging case.This patient has had chronic intermittent plantar fasciitis for the past 4 years. She's had exhaustive conservative treatment but she has not tried custom orthotics. We will start with a pair of green sports insoles and scaphoid pads and she will return to the office in 4 weeks for reevaluation. I may plan on doing custom orthotics at that visit. If her symptoms become more severe then I suggested that she consider the high-frequency ultrasound treatment offered to her by podiatry. She did get some transient improvement in her symptoms with low frequency treatment. I cautioned her against surgery. She is not interested in any sort of surgical correction. Patient will call with questions or concerns prior to her follow-up visit.

## 2018-03-24 NOTE — Assessment & Plan Note (Addendum)
-   Chronic with waxing and waning severity. - Provided green insoles with scaphoid pads to try in shoes. Would recommend trying custom orthotics thereafter. - Would consider high frequency ultrasound in future if other conservative measures fail given chronicity of problem.

## 2018-03-25 ENCOUNTER — Encounter: Payer: Self-pay | Admitting: Sports Medicine

## 2018-03-26 ENCOUNTER — Ambulatory Visit: Payer: BLUE CROSS/BLUE SHIELD | Admitting: Sports Medicine

## 2018-04-28 ENCOUNTER — Other Ambulatory Visit: Payer: Self-pay | Admitting: Obstetrics and Gynecology

## 2018-04-28 DIAGNOSIS — Z1231 Encounter for screening mammogram for malignant neoplasm of breast: Secondary | ICD-10-CM

## 2018-05-02 IMAGING — MG DIGITAL SCREENING BILATERAL MAMMOGRAM WITH CAD
5 series · 5 of 5 positions shown · non-contrast
Comparison: Previous exam(s).

ACR Breast Density Category a: The breast tissue is almost entirely
fatty.

CLINICAL DATA: Screening.

EXAM:
DIGITAL SCREENING BILATERAL MAMMOGRAM WITH CAD

[R MLO (1 of 2)]
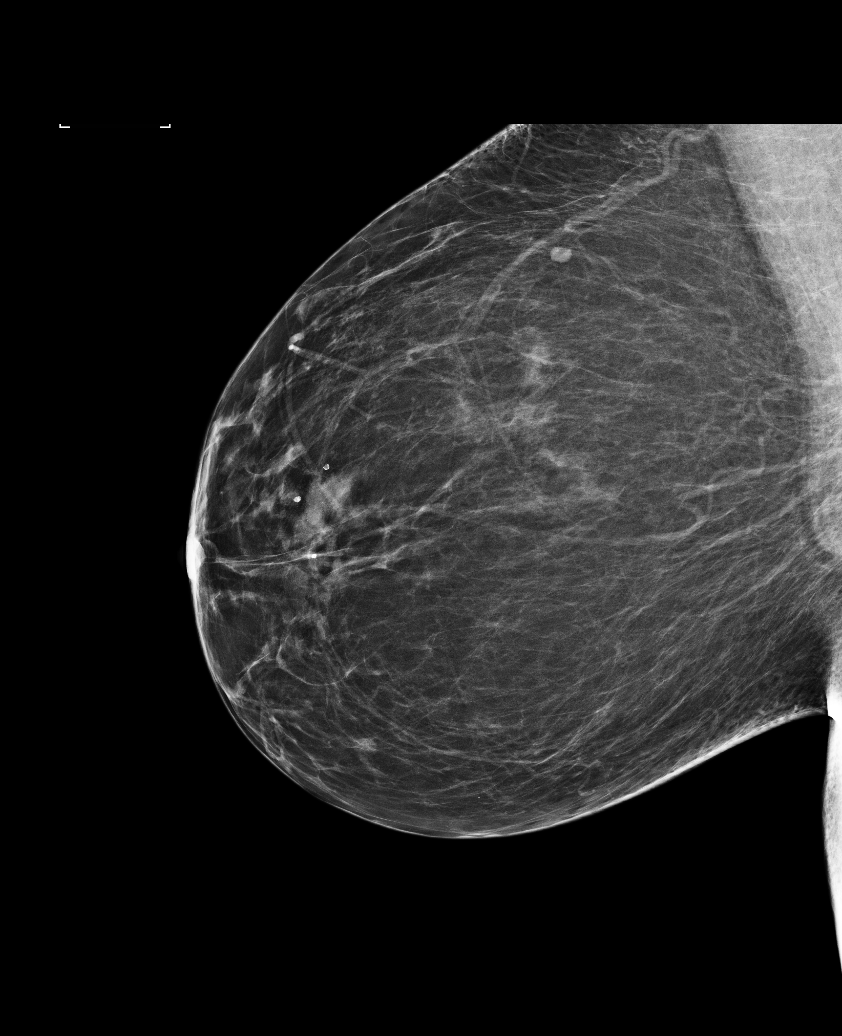

[L MLO]
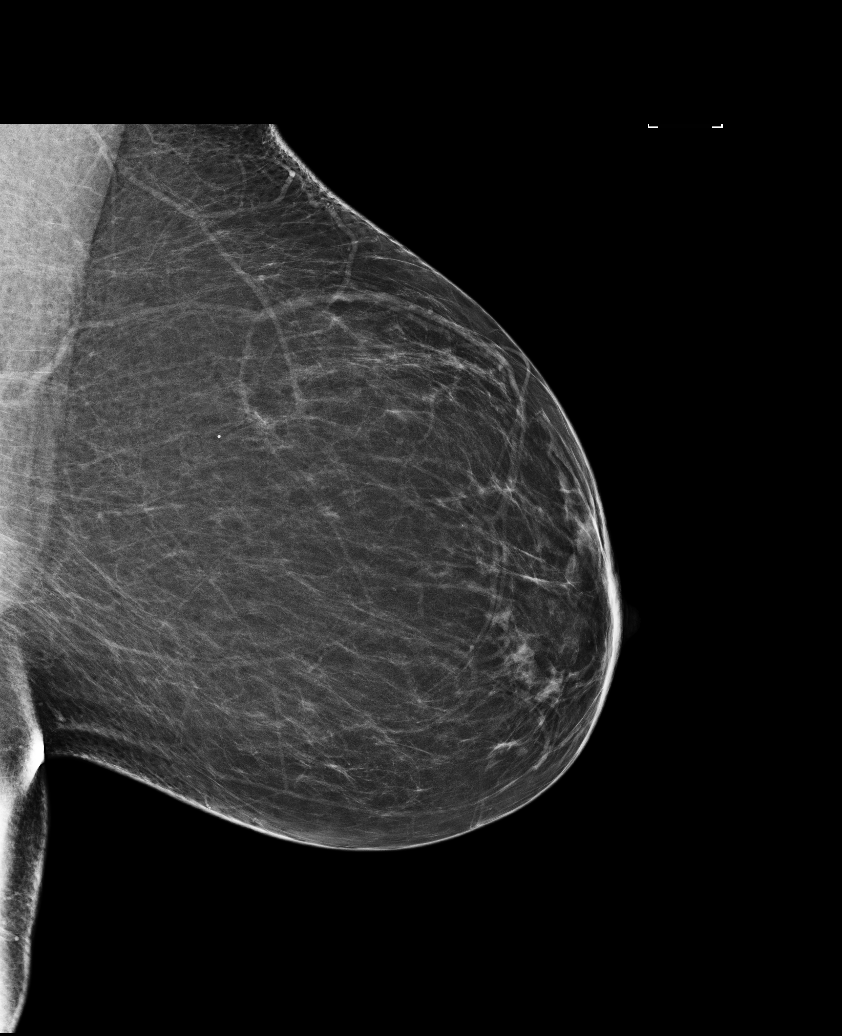

[R CC]
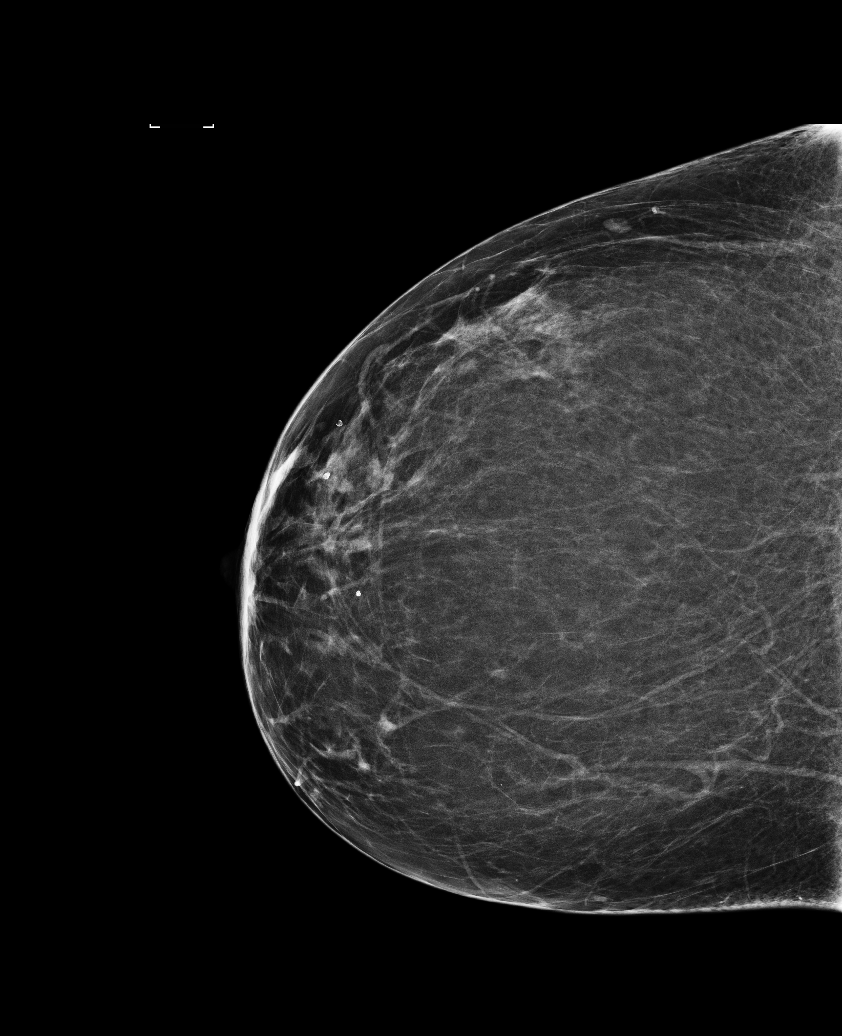

[L CC]
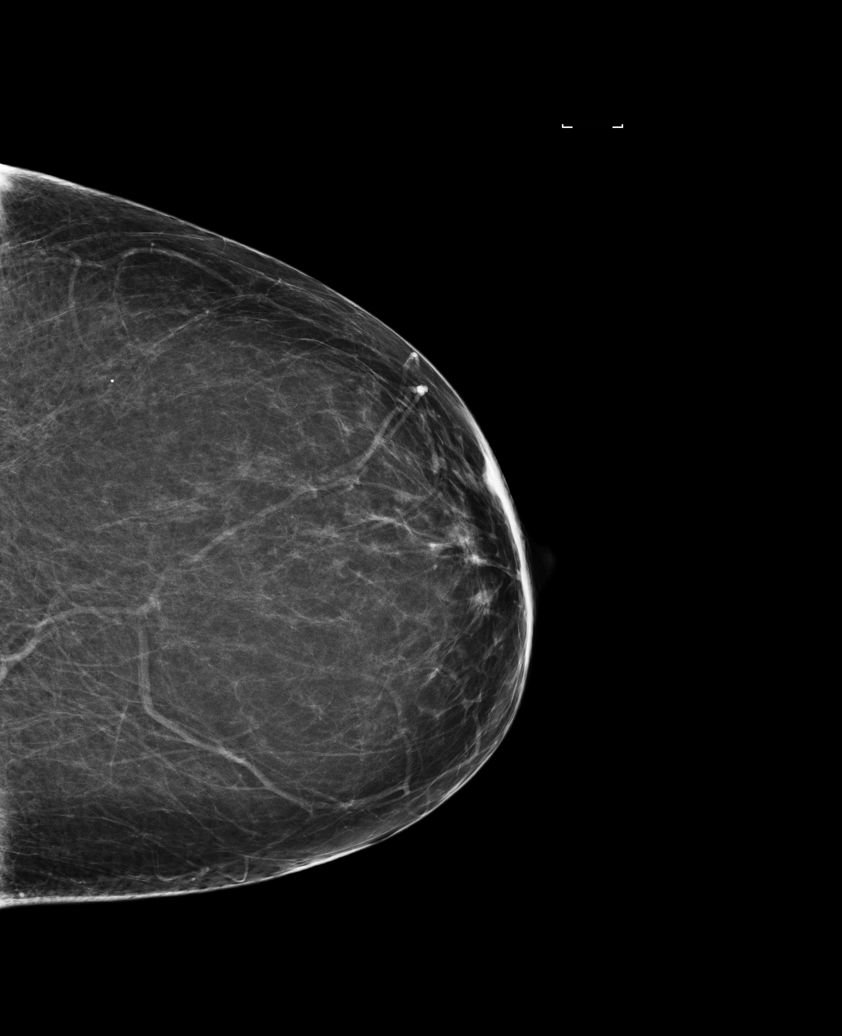

[R MLO (2 of 2)]
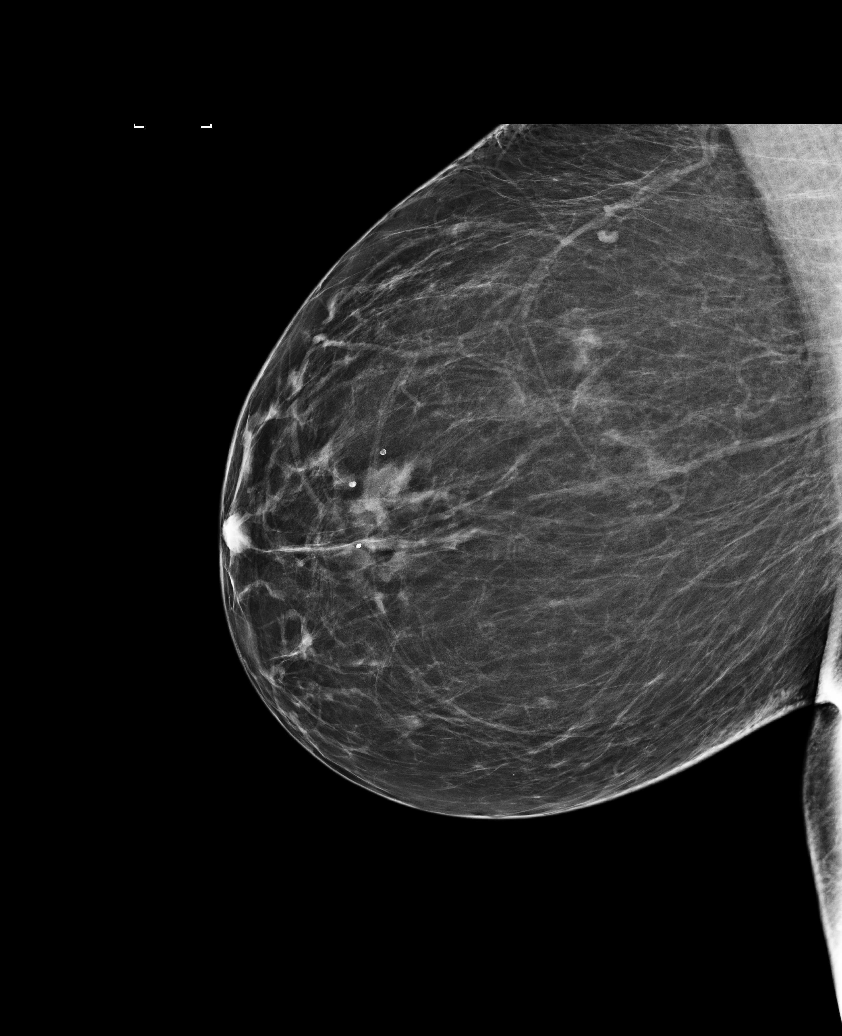

[5 of 5 positions shown; findings below may reference images not displayed]

FINDINGS: There are no findings suspicious for malignancy. Images were
processed with CAD.
IMPRESSION: No mammographic evidence of malignancy. A result letter of this
screening mammogram will be mailed directly to the patient.

RECOMMENDATION:
Screening mammogram in one year. (Code:[id number])

BI-RADS CATEGORY  1: Negative.

## 2018-06-18 ENCOUNTER — Ambulatory Visit (INDEPENDENT_AMBULATORY_CARE_PROVIDER_SITE_OTHER): Payer: Medicare Other | Admitting: Sports Medicine

## 2018-06-18 ENCOUNTER — Encounter: Payer: Self-pay | Admitting: Sports Medicine

## 2018-06-18 VITALS — BP 155/79 | Ht 66.5 in | Wt 168.0 lb

## 2018-06-18 DIAGNOSIS — Q667 Congenital pes cavus, unspecified foot: Secondary | ICD-10-CM

## 2018-06-18 DIAGNOSIS — M722 Plantar fascial fibromatosis: Secondary | ICD-10-CM

## 2018-06-18 NOTE — Progress Notes (Signed)
  Patient presents today for custom orthotics. Please see the office note from 03/24/2018 for details regarding history and physical exam findings regarding this patient with chronic bilateral plantar fasciitis. She has found the green sports insoles with scaphoid pads to be very helpful. She would like to proceed with custom orthotics.  Custom orthotics were created for her today. She found them to be comfortable prior to leaving the office. 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 Total time spent with the patient was 30 minutes with greater than 50% of the time spent in face-to-face consultation discussing orthotic construction, instruction, and fitting. Patient will follow-up as needed.  Patient was fitted for a : standard, cushioned, semi-rigid orthotic. The orthotic was heated and afterward the patient stood on the orthotic blank positioned on the orthotic stand. The patient was positioned in subtalar neutral position and 10 degrees of ankle dorsiflexion in a weight bearing stance. After completion of molding, a stable base was applied to the orthotic blank. The blank was ground to a stable position for weight bearing. Size: 7 Base: Blue EVA Posting: none Additional orthotic padding: none

## 2018-06-22 DIAGNOSIS — F419 Anxiety disorder, unspecified: Secondary | ICD-10-CM | POA: Diagnosis not present

## 2018-06-22 DIAGNOSIS — F330 Major depressive disorder, recurrent, mild: Secondary | ICD-10-CM | POA: Diagnosis not present

## 2018-06-23 DIAGNOSIS — J342 Deviated nasal septum: Secondary | ICD-10-CM | POA: Diagnosis not present

## 2018-06-23 DIAGNOSIS — R51 Headache: Secondary | ICD-10-CM | POA: Diagnosis not present

## 2018-07-13 DIAGNOSIS — E559 Vitamin D deficiency, unspecified: Secondary | ICD-10-CM | POA: Diagnosis not present

## 2018-07-13 DIAGNOSIS — R82998 Other abnormal findings in urine: Secondary | ICD-10-CM | POA: Diagnosis not present

## 2018-07-13 DIAGNOSIS — E7849 Other hyperlipidemia: Secondary | ICD-10-CM | POA: Diagnosis not present

## 2018-07-17 DIAGNOSIS — Z1212 Encounter for screening for malignant neoplasm of rectum: Secondary | ICD-10-CM | POA: Diagnosis not present

## 2018-07-20 DIAGNOSIS — G4709 Other insomnia: Secondary | ICD-10-CM | POA: Diagnosis not present

## 2018-07-20 DIAGNOSIS — K219 Gastro-esophageal reflux disease without esophagitis: Secondary | ICD-10-CM | POA: Diagnosis not present

## 2018-07-20 DIAGNOSIS — Z23 Encounter for immunization: Secondary | ICD-10-CM | POA: Diagnosis not present

## 2018-07-20 DIAGNOSIS — E559 Vitamin D deficiency, unspecified: Secondary | ICD-10-CM | POA: Diagnosis not present

## 2018-07-20 DIAGNOSIS — M859 Disorder of bone density and structure, unspecified: Secondary | ICD-10-CM | POA: Diagnosis not present

## 2018-07-20 DIAGNOSIS — J4520 Mild intermittent asthma, uncomplicated: Secondary | ICD-10-CM | POA: Diagnosis not present

## 2018-07-20 DIAGNOSIS — F418 Other specified anxiety disorders: Secondary | ICD-10-CM | POA: Diagnosis not present

## 2018-07-20 DIAGNOSIS — J329 Chronic sinusitis, unspecified: Secondary | ICD-10-CM | POA: Diagnosis not present

## 2018-07-20 DIAGNOSIS — E7849 Other hyperlipidemia: Secondary | ICD-10-CM | POA: Diagnosis not present

## 2018-07-20 DIAGNOSIS — Z1389 Encounter for screening for other disorder: Secondary | ICD-10-CM | POA: Diagnosis not present

## 2018-07-20 DIAGNOSIS — Z0000 Encounter for general adult medical examination without abnormal findings: Secondary | ICD-10-CM | POA: Diagnosis not present

## 2018-07-20 DIAGNOSIS — Z6826 Body mass index (BMI) 26.0-26.9, adult: Secondary | ICD-10-CM | POA: Diagnosis not present

## 2018-09-29 DIAGNOSIS — J343 Hypertrophy of nasal turbinates: Secondary | ICD-10-CM | POA: Diagnosis not present

## 2018-09-29 DIAGNOSIS — J310 Chronic rhinitis: Secondary | ICD-10-CM | POA: Diagnosis not present

## 2018-09-29 DIAGNOSIS — J342 Deviated nasal septum: Secondary | ICD-10-CM | POA: Diagnosis not present

## 2018-09-29 DIAGNOSIS — R51 Headache: Secondary | ICD-10-CM | POA: Diagnosis not present

## 2018-10-16 DIAGNOSIS — Z23 Encounter for immunization: Secondary | ICD-10-CM | POA: Diagnosis not present

## 2018-12-10 DIAGNOSIS — R350 Frequency of micturition: Secondary | ICD-10-CM | POA: Diagnosis not present

## 2018-12-10 DIAGNOSIS — N390 Urinary tract infection, site not specified: Secondary | ICD-10-CM | POA: Diagnosis not present

## 2018-12-10 DIAGNOSIS — R300 Dysuria: Secondary | ICD-10-CM | POA: Diagnosis not present

## 2018-12-10 DIAGNOSIS — R3915 Urgency of urination: Secondary | ICD-10-CM | POA: Diagnosis not present

## 2018-12-10 DIAGNOSIS — R82998 Other abnormal findings in urine: Secondary | ICD-10-CM | POA: Diagnosis not present

## 2019-01-29 ENCOUNTER — Telehealth: Payer: Self-pay | Admitting: Internal Medicine

## 2019-01-29 NOTE — Telephone Encounter (Signed)
GI report received from [facility].  Pt would like to schedule for colonoscopy.  Records will be placed on your desk for review.

## 2019-02-01 NOTE — Telephone Encounter (Signed)
Records reviewed  I ask that we schedule her with different [facility] MD due to my current patient backlog of recalls, etc  Thanks

## 2019-02-02 NOTE — Telephone Encounter (Signed)
As I do not have a desk, please let me know where these records were placed. Thank you.

## 2019-02-02 NOTE — Telephone Encounter (Signed)
GI records from [place] placed on Dr. Payton Emerald desk for review.

## 2019-02-02 NOTE — Telephone Encounter (Signed)
Records reviewed. Please schedule an office consult - first available.

## 2019-02-02 NOTE — Telephone Encounter (Signed)
Records have been placed in your black box.

## 2019-02-08 ENCOUNTER — Encounter: Payer: Self-pay | Admitting: Gastroenterology

## 2019-02-12 DIAGNOSIS — H1045 Other chronic allergic conjunctivitis: Secondary | ICD-10-CM | POA: Diagnosis not present

## 2019-02-12 DIAGNOSIS — H2513 Age-related nuclear cataract, bilateral: Secondary | ICD-10-CM | POA: Diagnosis not present

## 2019-02-12 DIAGNOSIS — H0288A Meibomian gland dysfunction right eye, upper and lower eyelids: Secondary | ICD-10-CM | POA: Diagnosis not present

## 2019-02-12 DIAGNOSIS — H0288B Meibomian gland dysfunction left eye, upper and lower eyelids: Secondary | ICD-10-CM | POA: Diagnosis not present

## 2019-02-12 DIAGNOSIS — H04123 Dry eye syndrome of bilateral lacrimal glands: Secondary | ICD-10-CM | POA: Diagnosis not present

## 2019-02-12 DIAGNOSIS — H35373 Puckering of macula, bilateral: Secondary | ICD-10-CM | POA: Diagnosis not present

## 2019-02-19 ENCOUNTER — Encounter: Payer: Self-pay | Admitting: Gastroenterology

## 2019-02-19 ENCOUNTER — Ambulatory Visit (INDEPENDENT_AMBULATORY_CARE_PROVIDER_SITE_OTHER): Payer: Medicare Other | Admitting: Gastroenterology

## 2019-02-19 VITALS — BP 132/80 | HR 84 | Ht 66.5 in | Wt 165.0 lb

## 2019-02-19 DIAGNOSIS — M25562 Pain in left knee: Secondary | ICD-10-CM | POA: Diagnosis not present

## 2019-02-19 DIAGNOSIS — Z1211 Encounter for screening for malignant neoplasm of colon: Secondary | ICD-10-CM

## 2019-02-19 DIAGNOSIS — K219 Gastro-esophageal reflux disease without esophagitis: Secondary | ICD-10-CM

## 2019-02-19 DIAGNOSIS — S93491A Sprain of other ligament of right ankle, initial encounter: Secondary | ICD-10-CM | POA: Diagnosis not present

## 2019-02-19 NOTE — Progress Notes (Signed)
Referring Provider: [name], MD Primary Care Physician:  Misty Hatchet, MD   Reason for Consultation:  Need for colonoscopy   IMPRESSION:  History of incomplete colonoscopy 2014 Misty [name]) Normal colonoscopy in 2004 Misty [name]) Need for colonoscopy GERD controlled on daily Nexium for years No family history of colon cancer or polyps  She is due routine colonoscopy. Will schedule at her convenience.  She is on long-term PPI therapy.  We discussed some of the long term risks of PPI therapy including: the need for calcium with vit. D supplements because of possible interference with absorption; potential increased risk of bone fractures, GI infections, cardiac related disease, kidney disease, c. diff infection; potential of vitamin D, B12 and magnesium malabsorption that requires chronic monitoring; potential for causing osteoporosis and the need for monitoring. We briefly discussed the potential risks of use of these medicines with the benefit of their use. We discussed the option of discontinuation.  There obviously is a risk in both taking the medicines as well as stopping the medicines in certain disease processes. All questions were answered to the best of my ability.   We also discussed transoral fundoplication as an alternative to long-term PPI use.   PLAN: Colonoscopy (colonoscopy and abdominal wall pressure in an effort to complete the exam) TIF brochure provided. She may make an appointment with Misty [name] at any time if she would like to learn more about the procedure.    I consented the patient at the bedside today discussing the risks, benefits, and alternatives to endoscopic evaluation. In particular, we discussed the risks that include, but are not limited to, reaction to medication, cardiopulmonary compromise, bleeding requiring blood transfusion, aspiration resulting in pneumonia, perforation requiring surgery, lack of diagnosis, severe illness requiring  hospitalization, and even death. We reviewed the risk of missed lesion including polyps or even cancer. The patient acknowledges these risks and asks that we proceed.   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 Ardeth [name]. The history is obtained through the patient and records from Dr. Ardeth [name].   Seen by Dr. Carlean [name] in 2006 through a clinical trial for reflux.  Has been on daily Nexium since then with complete control of symptoms.   Normal screening colonoscopy 2004 with Dr. Bethann [name]. Colonoscopy 2014 with Dr. Mariann [name] was only a flexible sigmoidoscopy because of an inability to advance beyond the sigmoid. Has been doing an annual stool guaiac test since that time with plans for attempted colonoscopy in the future. She notes being overdue now.    Labs from 07/13/18 showed a hgb 14.1, platelets 233.  Past Medical History:  Diagnosis Date  . Allergy   . Depression   . GERD (gastroesophageal reflux disease)   . GERD (gastroesophageal reflux disease)   . Headache   . Hypercholesteremia     Past Surgical History:  Procedure Laterality Date  . MYOMECTOMY  1996  . PLANTAR FASCIA SURGERY      Current Outpatient Medications  Medication Sig Dispense Refill  . albuterol (PROVENTIL HFA;VENTOLIN HFA) 108 (90 BASE) MCG/ACT inhaler Inhale 2 puffs into the lungs every 6 (six) hours as needed for wheezing or shortness of breath.    . Cholecalciferol (VITAMIN D) 125 MCG (5000 UT) CAPS Take 5,000 Units by mouth every other day.    . esomeprazole (NEXIUM) 40 MG capsule Take 1 capsule by mouth daily.    . montelukast (SINGULAIR) 10 MG tablet Take 1 tablet (10  mg total) by mouth at bedtime. (Patient taking differently: Take 10 mg by mouth daily as needed. ) 90 tablet 2  . Multiple Vitamins-Minerals (MULTIVITAMIN PO) Take 1 tablet by mouth daily.    . traZODone (DESYREL) 50 MG tablet Take 50 mg by mouth at bedtime as needed.    .  venlafaxine XR (EFFEXOR-XR) 75 MG 24 hr capsule Take 1 capsule by mouth daily.  0   No current facility-administered medications for this visit.     Allergies as of 02/19/2019  . (No Known Allergies)    Family History  Problem Relation Age of Onset  . Dementia Mother   . Hypertension Mother   . Heart disease Father   . Rheum arthritis Father   . Lupus Father   . Stroke Maternal Grandfather   . Breast cancer Neg Hx   . Colon cancer Neg Hx   . Stomach cancer Neg Hx     Social History   Socioeconomic History  . Marital status: Married    Spouse name: Not on file  . Number of children: 0  . Years of education: Not on file  . Highest education level: Not on file  Occupational History  . Occupation: non Airline pilot  Social Needs  . Financial resource strain: Not on file  . Food insecurity:    Worry: Not on file    Inability: Not on file  . Transportation needs:    Medical: Not on file    Non-medical: Not on file  Tobacco Use  . Smoking status: Never Smoker  . Smokeless tobacco: Never Used  Substance and Sexual Activity  . Alcohol use: Yes    Alcohol/week: 0.0 standard drinks    Comment: 1-2 glasses wine a week  . Drug use: No  . Sexual activity: Yes    Partners: Male  Lifestyle  . Physical activity:    Days per week: Not on file    Minutes per session: Not on file  . Stress: Not on file  Relationships  . Social connections:    Talks on phone: Not on file    Gets together: Not on file    Attends religious service: Not on file    Active member of club or organization: Not on file    Attends meetings of clubs or organizations: Not on file    Relationship status: Not on file  . Intimate partner violence:    Fear of current or ex partner: Not on file    Emotionally abused: Not on file    Physically abused: Not on file    Forced sexual activity: Not on file  Other Topics Concern  . Not on file  Social History Narrative  . Not on file    Review of  Systems: 12 system ROS is negative except as noted above with the additions of allergies and headaches.  Filed Weights   02/19/19 1002  Weight: 165 lb (74.8 kg)    Physical Exam: Vital signs were reviewed. General:   Alert, well-nourished, pleasant and cooperative in NAD Head:  Normocephalic and atraumatic. Eyes:  Sclera clear, no icterus.   Conjunctiva pink. Mouth:  No deformity or lesions.   Neck:  Supple; no thyromegaly. Lungs:  Clear throughout to auscultation.   No wheezes.  Heart:  Regular rate and rhythm; no murmurs Abdomen:  Soft, nontender, normal bowel sounds. No rebound or guarding. No hepatosplenomegaly Rectal:  Deferred  Msk:  Symmetrical without gross deformities. Extremities:  No gross deformities or  edema. Neurologic:  Alert and  oriented x4;  grossly nonfocal Skin:  No rash or bruise. Psych:  Alert and cooperative. Normal mood and affect.   Kyree Fedorko L. Tarri Glenn, MD, MPH [facility] 02/19/2019, 10:55 AM

## 2019-02-19 NOTE — Patient Instructions (Signed)
We will call you to schedule a colonoscopy in May 2020.

## 2019-02-24 DIAGNOSIS — Z6826 Body mass index (BMI) 26.0-26.9, adult: Secondary | ICD-10-CM | POA: Diagnosis not present

## 2019-02-24 DIAGNOSIS — D259 Leiomyoma of uterus, unspecified: Secondary | ICD-10-CM | POA: Diagnosis not present

## 2019-02-24 DIAGNOSIS — M810 Age-related osteoporosis without current pathological fracture: Secondary | ICD-10-CM | POA: Diagnosis not present

## 2019-02-24 DIAGNOSIS — N952 Postmenopausal atrophic vaginitis: Secondary | ICD-10-CM | POA: Diagnosis not present

## 2019-02-24 DIAGNOSIS — Z01419 Encounter for gynecological examination (general) (routine) without abnormal findings: Secondary | ICD-10-CM | POA: Diagnosis not present

## 2019-05-10 DIAGNOSIS — M8588 Other specified disorders of bone density and structure, other site: Secondary | ICD-10-CM | POA: Diagnosis not present

## 2019-05-10 DIAGNOSIS — N958 Other specified menopausal and perimenopausal disorders: Secondary | ICD-10-CM | POA: Diagnosis not present

## 2019-05-10 DIAGNOSIS — Z1231 Encounter for screening mammogram for malignant neoplasm of breast: Secondary | ICD-10-CM | POA: Diagnosis not present

## 2019-05-20 ENCOUNTER — Encounter: Payer: Self-pay | Admitting: Gastroenterology

## 2019-06-09 ENCOUNTER — Other Ambulatory Visit: Payer: Self-pay

## 2019-06-09 ENCOUNTER — Ambulatory Visit: Payer: Medicare Other

## 2019-06-09 VITALS — Ht 66.5 in | Wt 163.0 lb

## 2019-06-09 DIAGNOSIS — Z1211 Encounter for screening for malignant neoplasm of colon: Secondary | ICD-10-CM

## 2019-06-09 MED ORDER — NA SULFATE-K SULFATE-MG SULF 17.5-3.13-1.6 GM/177ML PO SOLN: 1.0000 | Freq: Once | ORAL | 0 refills | Status: AC

## 2019-06-09 NOTE — Progress Notes (Signed)
No egg or soy allergy known to patient  No issues with past sedation with any surgeries  or procedures, no intubation problems  No diet pills per patient No home 02 use per patient  No blood thinners per patient  Pt denies issues with constipation  No A fib or A flutter  EMMI video sent to pt's e mail  

## 2019-06-14 ENCOUNTER — Telehealth: Payer: Self-pay | Admitting: Gastroenterology

## 2019-06-14 NOTE — Telephone Encounter (Signed)
Switched patient to Miralax/Dulcolax. Patient denies constipation. New instructions mailed to patient-pt aware. Pt will call with questions.

## 2019-06-14 NOTE — Telephone Encounter (Signed)
Pt states that copay for suprep is over $100, she wants to know if she can have a more affordable prep.

## 2019-06-22 ENCOUNTER — Telehealth: Payer: Self-pay | Admitting: Gastroenterology

## 2019-06-22 NOTE — Telephone Encounter (Signed)
Patient called back and answered no to all questions.

## 2019-06-22 NOTE — Telephone Encounter (Signed)
LM on vmail to call back regarding Covid-19 prescreening questions   Covid-19 Screening Questions: Do you now or have you had a fever in the last 14 days?  Do you have any respiratory symptoms of shortness of breath or cough now or in the last 14 days?  Do you have any family members or close contacts with diagnosed or suspected Covid-19 in the past 14 days?  Have you been tested for Covid-19 and found to be positive?

## 2019-06-23 ENCOUNTER — Ambulatory Visit (AMBULATORY_SURGERY_CENTER): Payer: Medicare Other | Admitting: Gastroenterology

## 2019-06-23 ENCOUNTER — Other Ambulatory Visit: Payer: Self-pay

## 2019-06-23 ENCOUNTER — Encounter: Payer: Self-pay | Admitting: Gastroenterology

## 2019-06-23 VITALS — BP 131/71 | HR 64 | Temp 98.6°F | Resp 15 | Ht 66.0 in | Wt 163.0 lb

## 2019-06-23 DIAGNOSIS — D122 Benign neoplasm of ascending colon: Secondary | ICD-10-CM

## 2019-06-23 DIAGNOSIS — D124 Benign neoplasm of descending colon: Secondary | ICD-10-CM

## 2019-06-23 DIAGNOSIS — K635 Polyp of colon: Secondary | ICD-10-CM | POA: Diagnosis not present

## 2019-06-23 DIAGNOSIS — Z1211 Encounter for screening for malignant neoplasm of colon: Secondary | ICD-10-CM | POA: Diagnosis not present

## 2019-06-23 DIAGNOSIS — E7800 Pure hypercholesterolemia, unspecified: Secondary | ICD-10-CM | POA: Diagnosis not present

## 2019-06-23 DIAGNOSIS — K219 Gastro-esophageal reflux disease without esophagitis: Secondary | ICD-10-CM | POA: Diagnosis not present

## 2019-06-23 MED ORDER — SODIUM CHLORIDE 0.9 % IV SOLN: 500.0000 mL | Freq: Once | INTRAVENOUS | Status: DC

## 2019-06-23 NOTE — Progress Notes (Signed)
Pt's states no medical or surgical changes since previsit or office visit. 

## 2019-06-23 NOTE — Patient Instructions (Signed)
YOU HAD AN ENDOSCOPIC PROCEDURE TODAY AT [facility]:   Refer to the procedure report that was given to you for any specific questions about what was found during the examination.  If the procedure report does not answer your questions, please call your gastroenterologist to clarify.  If you requested that your care partner not be given the details of your procedure findings, then the procedure report has been included in a sealed envelope for you to review at your convenience later.  YOU SHOULD EXPECT: Some feelings of bloating in the abdomen. Passage of more gas than usual.  Walking can help get rid of the air that was put into your GI tract during the procedure and reduce the bloating. If you had a lower endoscopy (such as a colonoscopy or flexible sigmoidoscopy) you may notice spotting of blood in your stool or on the toilet paper. If you underwent a bowel prep for your procedure, you may not have a normal bowel movement for a few days.  Please Note:  You might notice some irritation and congestion in your nose or some drainage.  This is from the oxygen used during your procedure.  There is no need for concern and it should clear up in a day or so.  SYMPTOMS TO REPORT IMMEDIATELY:   Following lower endoscopy (colonoscopy or flexible sigmoidoscopy):  Excessive amounts of blood in the stool  Significant tenderness or worsening of abdominal pains  Swelling of the abdomen that is new, acute  Fever of 100F or higher   For urgent or emergent issues, a gastroenterologist can be reached at any hour by calling [phone].   DIET:  We do recommend a small meal at first, but then you may proceed to your regular diet.  Drink plenty of fluids but you should avoid alcoholic beverages for 24 hours.  MEDICATIONS: Continue present medications.  Consider using additional prep in the future.  Please see handouts given to you by your recovery nurse.  ACTIVITY:  You should plan to  take it easy for the rest of today and you should NOT DRIVE or use heavy machinery until tomorrow (because of the sedation medicines used during the test).    FOLLOW UP: Our staff will call the number listed on your records 48-72 hours following your procedure to check on you and address any questions or concerns that you may have regarding the information given to you following your procedure. If we do not reach you, we will leave a message.  We will attempt to reach you two times.  During this call, we will ask if you have developed any symptoms of COVID 19. If you develop any symptoms (ie: fever, flu-like symptoms, shortness of breath, cough etc.) before then, please call [phone].  If you test positive for Covid 19 in the 2 weeks post procedure, please call and report this information to [place].    If any biopsies were taken you will be contacted by phone or by letter within the next 1-3 weeks.  Please call us at [phone] if you have not heard about the biopsies in 3 weeks.   Thank you for allowing [place] to provide for your healthcare needs today.   SIGNATURES/CONFIDENTIALITY: You and/or your care partner have signed paperwork which will be entered into your electronic medical record.  These signatures attest to the fact that that the information above on your After Visit Summary has been reviewed and is understood.  Full responsibility of the confidentiality  of this discharge information lies with you and/or your care-partner.

## 2019-06-23 NOTE — Progress Notes (Signed)
Called to room to assist during endoscopic procedure.  Patient ID and intended procedure confirmed with present staff. Received instructions for my participation in the procedure from the performing physician.  

## 2019-06-23 NOTE — Progress Notes (Signed)
Report given to PACU, vss 

## 2019-06-23 NOTE — Op Note (Addendum)
[place] Patient Name: Misty [name] Procedure Date: 06/23/2019 10:44 AM MRN: [id number] Endoscopist: Thornton Park MD, MD Age: 68 Referring MD:  Date of Birth: Jul 11, 1951 Gender: Female Account #: [id number] Procedure:                Colonoscopy Indications:              Screening for colorectal malignant neoplasm, This                            is the patient's first colonoscopy.                           No known family history of colon cancer or polyps                           She had a screening flexible sigmoidoscopy with Dr.                            Earle Gell 11/11/2013. The flexible                            sigmoidoscopy was normal. It appears that a                            colonoscopy was attempted but not completed due to                            technical difficulty with significant looping in                            the sigmoid colon. A contrast barium enema was                            recommended. Medicines:                See the Anesthesia note for documentation of the                            administered medications Procedure:                Pre-Anesthesia Assessment:                           - Prior to the procedure, a History and Physical                            was performed, and patient medications and                            allergies were reviewed. The patient's tolerance of                            previous anesthesia was also reviewed. The risks  and benefits of the procedure and the sedation                            options and risks were discussed with the patient.                            All questions were answered, and informed consent                            was obtained. Prior Anticoagulants: The patient has                            taken no previous anticoagulant or antiplatelet                            agents. ASA Grade Assessment: II - A patient with             mild systemic disease. After reviewing the risks                            and benefits, the patient was deemed in                            satisfactory condition to undergo the procedure.                           After obtaining informed consent, the colonoscope                            was passed under direct vision. Throughout the                            procedure, the patient's blood pressure, pulse, and                            oxygen saturations were monitored continuously. The                            Colonoscope was introduced through the anus and                            advanced to the the terminal ileum, with                            identification of the appendiceal orifice and IC                            valve. A second forward view of the ascending colon                            was performed. The colonoscopy was technically                            difficult and complex  due to a redundant colon and                            significant looping. Successful completion of the                            procedure was aided by applying abdominal pressure.                            Sigmoid pressure was applied throughout the entire                            procedure. The patient tolerated the procedure                            well. The quality of the bowel preparation was                            good. The terminal ileum, ileocecal valve,                            appendiceal orifice, and rectum were photographed. Scope In: 10:52:05 AM Scope Out: 11:14:04 AM Scope Withdrawal Time: 0 hours 12 minutes 57 seconds  Total Procedure Duration: 0 hours 21 minutes 59 seconds  Findings:                 The perianal and digital rectal examinations were                            normal.                           Multiple small and large-mouthed diverticula were                            found in the sigmoid colon and descending colon.                            A 10 mm polyp was found in the descending colon.                            The polyp was pedunculated. The polyp was removed                            with a cold snare. Resection and retrieval were                            complete.                           A 3 mm polyp was found in the ascending colon. The                            polyp was sessile. The polyp was removed with a  cold snare. Resection and retrieval were complete.                            Estimated blood loss was minimal.                           A 1 mm polyp was found in the descending colon. The                            polyp was sessile. The polyp was removed with a                            cold snare. Resection and retrieval were complete.                            Estimated blood loss was minimal.                           The exam was otherwise without abnormality on                            direct and retroflexion views. Complications:            No immediate complications. Estimated blood loss:                            Minimal. Estimated Blood Loss:     Estimated blood loss was minimal. Impression:               - Diverticulosis in the sigmoid colon and in the                            descending colon.                           - One 10 mm polyp in the descending colon, removed                            with a cold snare. Resected and retrieved.                           - One 3 mm polyp in the ascending colon, removed                            with a cold snare. Resected and retrieved.                           - One 1 mm polyp in the descending colon, removed                            with a cold snare. Resected and retrieved.                           - The examination was otherwise normal on direct  and retroflexion views. Recommendation:           - Patient has a contact number available for                             emergencies. The signs and symptoms of potential                            delayed complications were discussed with the                            patient. Return to normal activities tomorrow.                            Written discharge instructions were provided to the                            patient.                           - Resume regular diet today. High fiber diet                            recommended.                           - Continue present medications.                           - Await pathology results.                           - Repeat colonoscopy date to be determined after                            pending pathology results are reviewed for                            surveillance.                           - Consider additional bowel prep in the future.                           - Future exam should be performed with colonoscope. Thornton Park MD, MD 06/23/2019 11:23:46 AM This report has been signed electronically. Addendum Number: 1   Addendum Date: 06/25/2019 4:13:48 PM      Please note an error in my documentation above. The patient had a normal       colonoscopy in 2004 with Dr. [name]. This was not her first       colonoscopy. Thornton Park MD, MD 06/25/2019 4:14:36 PM This report has been signed electronically.

## 2019-06-25 ENCOUNTER — Telehealth: Payer: Self-pay

## 2019-06-25 ENCOUNTER — Ambulatory Visit (INDEPENDENT_AMBULATORY_CARE_PROVIDER_SITE_OTHER): Payer: Medicare Other | Admitting: Gastroenterology

## 2019-06-25 ENCOUNTER — Encounter: Payer: Self-pay | Admitting: Gastroenterology

## 2019-06-25 DIAGNOSIS — Z86010 Personal history of colonic polyps: Secondary | ICD-10-CM | POA: Diagnosis not present

## 2019-06-25 NOTE — Telephone Encounter (Signed)
  Follow up Call-  Call back number 06/23/2019  Post procedure Call Back phone  # [phone]  Permission to leave phone message Yes  Some recent data might be hidden     Left message on voicemail Will try again midday

## 2019-06-25 NOTE — Telephone Encounter (Signed)
Patient scheduled and aware of 4 pm virtual visit.

## 2019-06-25 NOTE — Patient Instructions (Signed)
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   There was no cancer.  Given these results you should have another colonoscopy in 3 years to make sure that you do not develop additional polyps.  Please call with any additional questions or concerns prior to that time.

## 2019-06-25 NOTE — Telephone Encounter (Signed)
  Follow up Call-  Call back number 06/23/2019  Post procedure Call Back phone  # [phone]  Permission to leave phone message Yes  Some recent data might be hidden     Patient questions:  Do you have a fever, pain , or abdominal swelling? No. Pain Score  0 *  Have you tolerated food without any problems? Yes.    Have you been able to return to your normal activities? Yes.    Do you have any questions about your discharge instructions: Diet   No. Medications  No. Follow up visit  No.  Do you have questions or concerns about your Care? No.  Actions: * If pain score is 4 or above: No action needed, pain <4. 1. Have you developed a fever since your procedure? no  2.   Have you had an respiratory symptoms (SOB or cough) since your procedure? no  3.   Have you tested positive for COVID 19 since your procedure no  4.   Have you had any family members/close contacts diagnosed with the COVID 19 since your procedure?  no   If yes to any of these questions please route to Joylene John, RN and Alphonsa Gin, Therapist, sports.

## 2019-06-25 NOTE — Progress Notes (Signed)
TELEHEALTH VISIT  Referring Provider: [name], MD Primary Care Physician:  [name], MD   Tele-visit due to COVID-19 pandemic Patient requested visit virtually, consented to the virtual encounter via audio enabled telemedicine application (D[employer]) Contact made at: 16:00 06/25/19 Patient verified by name and date of birth Location of patient: Home Location provider: [facility] Names of persons participating: Me, patient, Tinnie Gens CMA Time spent on telehealth visit: 17 minutes I discussed the limitations of evaluation and management by telemedicine. The patient expressed understanding and agreed to proceed.  Chief complaint:  Multiple questions about her colonoscopy report   IMPRESSION:  Personal history of colon polyps    - 2 tubular adenomas and a sessile serrated polyp    - surveillance recommended in 3 years Normal colonoscopy in 2004 [name]) History of incomplete colonoscopy with Dr. [name] 11/11/13 (converted to flex sig) GERD controlled on daily Nexium for years No family history of colon cancer or polyps  Together we reviewed her endoscopy findings and pathology results from the polyps removed. Surveillance colonoscopy recommended in 3 years. Given the stool balls and residual liquid stool, will plan a different purgative at that time.   I added an addendum to her procedure note given my error not documenting her initial colonoscopy from 2004 and will mail a copy of the updated procedure note to the patient with her after visit summary (AVS) from today.   All questions were answered to her satisfaction.   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 She had a colonoscopy for colon cancer screening 06/23/19. Called today with multiple questions after reviewing her colonoscopy report: notifying me that I neglected to mention her colonoscopy in 2004 so that this was not her first colonoscopy,  question about report stating"good prep" but my suggestion/plans for additional prep for future exams, question about what it means to have a long and redundant colonoscopy, how her ASA was calculated.    Colonoscopy findings: - Diverticulosis in the sigmoid colon and in the descending colon. - One 10 mm polyp in the descending colon, removed with a cold snare. Resected and retrieved. - One 3 mm polyp in the ascending colon, removed with a cold snare. Resected and retrieved. - One 1 mm polyp in the descending colon, removed with a cold snare. Resected and retrieved. - The examination was otherwise normal on direct and retroflexion views.  Pathology results: Surgical [P], colon, ascending, descending, polyp (2) - TUBULAR ADENOMA(S). - SESSILE SERRATED POLYP WITHOUT CYTOLOGIC DYSPLASIA. - HIGH GRADE DYSPLASIA IS NOT IDENTIFIED. JOSHUA  Since her initial visit with me in February and prior to her colonoscopy, I reviewed records from [facility] gastroenterology.  She had a screening flexible sigmoidoscopy with Dr. Earle Gell 11/11/2013.  The flexible sigmoidoscopy was normal.  It appears that a colonoscopy was attempted but not completed due to technical difficulty with significant looping in the sigmoid colon.  A contrast barium enema was recommended.   Past Medical History:  Diagnosis Date  . Allergy   . Depression   . GERD (gastroesophageal reflux disease)   . GERD (gastroesophageal reflux disease)   . Headache   . Hypercholesteremia     Past Surgical History:  Procedure Laterality Date  . COLONOSCOPY    . MYOMECTOMY  1996  . PLANTAR FASCIA SURGERY    . TONSILLECTOMY  1966    Current Outpatient Medications  Medication Sig Dispense Refill  . albuterol (PROVENTIL HFA;VENTOLIN HFA)  108 (90 BASE) MCG/ACT inhaler Inhale 2 puffs into the lungs every 6 (six) hours as needed for wheezing or shortness of breath.    . Cholecalciferol (VITAMIN D) 125 MCG (5000 UT) CAPS Take 5,000 Units  by mouth every other day.    . esomeprazole (NEXIUM) 40 MG capsule Take 1 capsule by mouth daily.    . Magnesium Gluconate 550 MG TABS Take by mouth.    . montelukast (SINGULAIR) 10 MG tablet Take 1 tablet (10 mg total) by mouth at bedtime. (Patient not taking: Reported on 06/09/2019) 90 tablet 2  . Multiple Vitamins-Minerals (MULTIVITAMIN PO) Take 1 tablet by mouth daily.    . traZODone (DESYREL) 50 MG tablet Take 50 mg by mouth at bedtime as needed.    . triamcinolone (NASACORT) 55 MCG/ACT AERO nasal inhaler Place into the nose.    . venlafaxine XR (EFFEXOR-XR) 75 MG 24 hr capsule Take 1 capsule by mouth daily.  0  . vitamin B-12 (CYANOCOBALAMIN) 500 MCG tablet Take by mouth.     Current Facility-Administered Medications  Medication Dose Route Frequency Provider Last Rate Last Dose  . 0.9 %  sodium chloride infusion  500 mL Intravenous Once Thornton Park, MD        Allergies as of 06/25/2019 - Review Complete 06/23/2019  Allergen Reaction Noted  . Latex Hives 08/11/2017  . Macrobid  [nitrofurantoin macrocrystal]  06/09/2019    Family History  Problem Relation Age of Onset  . Dementia Mother   . Hypertension Mother   . Heart disease Father   . Rheum arthritis Father   . Lupus Father   . Stroke Maternal Grandfather   . Breast cancer Neg Hx   . Colon cancer Neg Hx   . Stomach cancer Neg Hx   . Esophageal cancer Neg Hx   . Rectal cancer Neg Hx     Social History   Socioeconomic History  . Marital status: Married    Spouse name: Not on file  . Number of children: 0  . Years of education: Not on file  . Highest education level: Not on file  Occupational History  . Occupation: non Airline pilot  Social Needs  . Financial resource strain: Not on file  . Food insecurity    Worry: Not on file    Inability: Not on file  . Transportation needs    Medical: Not on file    Non-medical: Not on file  Tobacco Use  . Smoking status: Never Smoker  . Smokeless tobacco:  Never Used  Substance and Sexual Activity  . Alcohol use: Yes    Alcohol/week: 0.0 standard drinks    Comment: 1-2 glasses wine a week  . Drug use: No  . Sexual activity: Yes    Partners: Male  Lifestyle  . Physical activity    Days per week: Not on file    Minutes per session: Not on file  . Stress: Not on file  Relationships  . Social Herbalist on phone: Not on file    Gets together: Not on file    Attends religious service: Not on file    Active member of club or organization: Not on file    Attends meetings of clubs or organizations: Not on file    Relationship status: Not on file  . Intimate partner violence    Fear of current or ex partner: Not on file    Emotionally abused: Not on file    Physically abused:  Not on file    Forced sexual activity: Not on file  Other Topics Concern  . Not on file  Social History Narrative  . Not on file    Review of Systems: ALL ROS discussed and all others negative except listed in HPI.  Physical Exam: Complete physical exam not performed due to the limits inherent in a telehealth encounter.  General: Awake, alert, and oriented, and well communicative. In no acute distress.  Pulm: No labored breathing, speaking in full sentences without conversational dyspnea  Psych: Pleasant, cooperative, normal speech, normal affect and normal insight Neuro: Alert and appropriate   Ido Wollman L. Tarri Glenn, MD, MPH [facility] 06/25/2019, 2:17 PM

## 2019-06-25 NOTE — Telephone Encounter (Signed)
Please offer this patient a virtual encounter with me to discuss her multiple questions. Could add on at 4pm today if she is available. Thanks.

## 2019-07-22 ENCOUNTER — Ambulatory Visit: Payer: Medicare Other | Admitting: Gastroenterology

## 2019-09-29 DIAGNOSIS — D225 Melanocytic nevi of trunk: Secondary | ICD-10-CM | POA: Diagnosis not present

## 2019-09-29 DIAGNOSIS — L819 Disorder of pigmentation, unspecified: Secondary | ICD-10-CM | POA: Diagnosis not present

## 2019-09-29 DIAGNOSIS — L821 Other seborrheic keratosis: Secondary | ICD-10-CM | POA: Diagnosis not present

## 2019-09-29 DIAGNOSIS — B372 Candidiasis of skin and nail: Secondary | ICD-10-CM | POA: Diagnosis not present

## 2019-09-29 DIAGNOSIS — L820 Inflamed seborrheic keratosis: Secondary | ICD-10-CM | POA: Diagnosis not present

## 2019-09-29 DIAGNOSIS — L438 Other lichen planus: Secondary | ICD-10-CM | POA: Diagnosis not present

## 2019-09-29 DIAGNOSIS — D1801 Hemangioma of skin and subcutaneous tissue: Secondary | ICD-10-CM | POA: Diagnosis not present

## 2019-09-29 DIAGNOSIS — L814 Other melanin hyperpigmentation: Secondary | ICD-10-CM | POA: Diagnosis not present

## 2019-10-12 DIAGNOSIS — Z23 Encounter for immunization: Secondary | ICD-10-CM | POA: Diagnosis not present

## 2019-11-02 DIAGNOSIS — E7849 Other hyperlipidemia: Secondary | ICD-10-CM | POA: Diagnosis not present

## 2019-11-02 DIAGNOSIS — E559 Vitamin D deficiency, unspecified: Secondary | ICD-10-CM | POA: Diagnosis not present

## 2019-11-02 DIAGNOSIS — R82998 Other abnormal findings in urine: Secondary | ICD-10-CM | POA: Diagnosis not present

## 2019-11-08 DIAGNOSIS — J329 Chronic sinusitis, unspecified: Secondary | ICD-10-CM | POA: Diagnosis not present

## 2019-11-08 DIAGNOSIS — M419 Scoliosis, unspecified: Secondary | ICD-10-CM | POA: Diagnosis not present

## 2019-11-08 DIAGNOSIS — E785 Hyperlipidemia, unspecified: Secondary | ICD-10-CM | POA: Diagnosis not present

## 2019-11-08 DIAGNOSIS — M25569 Pain in unspecified knee: Secondary | ICD-10-CM | POA: Diagnosis not present

## 2019-11-08 DIAGNOSIS — F330 Major depressive disorder, recurrent, mild: Secondary | ICD-10-CM | POA: Diagnosis not present

## 2019-11-08 DIAGNOSIS — M8580 Other specified disorders of bone density and structure, unspecified site: Secondary | ICD-10-CM | POA: Diagnosis not present

## 2019-11-08 DIAGNOSIS — G4700 Insomnia, unspecified: Secondary | ICD-10-CM | POA: Diagnosis not present

## 2019-11-08 DIAGNOSIS — E559 Vitamin D deficiency, unspecified: Secondary | ICD-10-CM | POA: Diagnosis not present

## 2019-11-08 DIAGNOSIS — F419 Anxiety disorder, unspecified: Secondary | ICD-10-CM | POA: Diagnosis not present

## 2019-11-08 DIAGNOSIS — K219 Gastro-esophageal reflux disease without esophagitis: Secondary | ICD-10-CM | POA: Diagnosis not present

## 2019-11-08 DIAGNOSIS — Z1331 Encounter for screening for depression: Secondary | ICD-10-CM | POA: Diagnosis not present

## 2019-11-08 DIAGNOSIS — Z0000 Encounter for general adult medical examination without abnormal findings: Secondary | ICD-10-CM | POA: Diagnosis not present

## 2019-12-23 DIAGNOSIS — Z1212 Encounter for screening for malignant neoplasm of rectum: Secondary | ICD-10-CM | POA: Diagnosis not present

## 2020-03-14 DIAGNOSIS — Z1152 Encounter for screening for COVID-19: Secondary | ICD-10-CM | POA: Diagnosis not present

## 2020-03-14 DIAGNOSIS — R05 Cough: Secondary | ICD-10-CM | POA: Diagnosis not present

## 2020-03-14 DIAGNOSIS — J329 Chronic sinusitis, unspecified: Secondary | ICD-10-CM | POA: Diagnosis not present

## 2020-03-27 DIAGNOSIS — Z6827 Body mass index (BMI) 27.0-27.9, adult: Secondary | ICD-10-CM | POA: Diagnosis not present

## 2020-03-27 DIAGNOSIS — Z124 Encounter for screening for malignant neoplasm of cervix: Secondary | ICD-10-CM | POA: Diagnosis not present

## 2020-03-27 DIAGNOSIS — N952 Postmenopausal atrophic vaginitis: Secondary | ICD-10-CM | POA: Diagnosis not present

## 2020-03-27 DIAGNOSIS — M8580 Other specified disorders of bone density and structure, unspecified site: Secondary | ICD-10-CM | POA: Diagnosis not present

## 2020-05-11 DIAGNOSIS — Z1231 Encounter for screening mammogram for malignant neoplasm of breast: Secondary | ICD-10-CM | POA: Diagnosis not present

## 2020-05-16 DIAGNOSIS — E785 Hyperlipidemia, unspecified: Secondary | ICD-10-CM | POA: Diagnosis not present

## 2020-05-16 DIAGNOSIS — G4700 Insomnia, unspecified: Secondary | ICD-10-CM | POA: Diagnosis not present

## 2020-05-16 DIAGNOSIS — F330 Major depressive disorder, recurrent, mild: Secondary | ICD-10-CM | POA: Diagnosis not present

## 2020-05-16 DIAGNOSIS — K1370 Unspecified lesions of oral mucosa: Secondary | ICD-10-CM | POA: Diagnosis not present

## 2020-05-16 DIAGNOSIS — M5090 Cervical disc disorder, unspecified, unspecified cervical region: Secondary | ICD-10-CM | POA: Diagnosis not present

## 2020-06-07 DIAGNOSIS — M25562 Pain in left knee: Secondary | ICD-10-CM | POA: Diagnosis not present

## 2020-06-07 DIAGNOSIS — M25511 Pain in right shoulder: Secondary | ICD-10-CM | POA: Diagnosis not present

## 2020-06-07 DIAGNOSIS — M542 Cervicalgia: Secondary | ICD-10-CM | POA: Diagnosis not present

## 2020-08-03 DIAGNOSIS — R030 Elevated blood-pressure reading, without diagnosis of hypertension: Secondary | ICD-10-CM | POA: Diagnosis not present

## 2020-08-03 DIAGNOSIS — B370 Candidal stomatitis: Secondary | ICD-10-CM | POA: Diagnosis not present

## 2020-08-03 DIAGNOSIS — J4521 Mild intermittent asthma with (acute) exacerbation: Secondary | ICD-10-CM | POA: Diagnosis not present

## 2020-08-03 DIAGNOSIS — J45909 Unspecified asthma, uncomplicated: Secondary | ICD-10-CM | POA: Diagnosis not present

## 2020-10-23 DIAGNOSIS — Z23 Encounter for immunization: Secondary | ICD-10-CM | POA: Diagnosis not present

## 2020-11-07 DIAGNOSIS — E785 Hyperlipidemia, unspecified: Secondary | ICD-10-CM | POA: Diagnosis not present

## 2020-11-07 DIAGNOSIS — E559 Vitamin D deficiency, unspecified: Secondary | ICD-10-CM | POA: Diagnosis not present

## 2020-11-14 DIAGNOSIS — G4700 Insomnia, unspecified: Secondary | ICD-10-CM | POA: Diagnosis not present

## 2020-11-14 DIAGNOSIS — N3946 Mixed incontinence: Secondary | ICD-10-CM | POA: Diagnosis not present

## 2020-11-14 DIAGNOSIS — Z0000 Encounter for general adult medical examination without abnormal findings: Secondary | ICD-10-CM | POA: Diagnosis not present

## 2020-11-14 DIAGNOSIS — F330 Major depressive disorder, recurrent, mild: Secondary | ICD-10-CM | POA: Diagnosis not present

## 2020-12-12 DIAGNOSIS — Z1212 Encounter for screening for malignant neoplasm of rectum: Secondary | ICD-10-CM | POA: Diagnosis not present

## 2021-01-03 DIAGNOSIS — N3946 Mixed incontinence: Secondary | ICD-10-CM | POA: Diagnosis not present

## 2021-01-03 DIAGNOSIS — R351 Nocturia: Secondary | ICD-10-CM | POA: Diagnosis not present

## 2021-01-03 DIAGNOSIS — R350 Frequency of micturition: Secondary | ICD-10-CM | POA: Diagnosis not present

## 2021-01-08 DIAGNOSIS — K1329 Other disturbances of oral epithelium, including tongue: Secondary | ICD-10-CM | POA: Diagnosis not present

## 2021-01-17 DIAGNOSIS — M6289 Other specified disorders of muscle: Secondary | ICD-10-CM | POA: Diagnosis not present

## 2021-01-17 DIAGNOSIS — M6281 Muscle weakness (generalized): Secondary | ICD-10-CM | POA: Diagnosis not present

## 2021-01-17 DIAGNOSIS — N3946 Mixed incontinence: Secondary | ICD-10-CM | POA: Diagnosis not present

## 2021-01-17 DIAGNOSIS — M62838 Other muscle spasm: Secondary | ICD-10-CM | POA: Diagnosis not present

## 2021-01-17 DIAGNOSIS — R351 Nocturia: Secondary | ICD-10-CM | POA: Diagnosis not present

## 2021-01-17 DIAGNOSIS — R350 Frequency of micturition: Secondary | ICD-10-CM | POA: Diagnosis not present

## 2021-01-22 DIAGNOSIS — H04123 Dry eye syndrome of bilateral lacrimal glands: Secondary | ICD-10-CM | POA: Diagnosis not present

## 2021-01-22 DIAGNOSIS — H2513 Age-related nuclear cataract, bilateral: Secondary | ICD-10-CM | POA: Diagnosis not present

## 2021-01-22 DIAGNOSIS — H0288B Meibomian gland dysfunction left eye, upper and lower eyelids: Secondary | ICD-10-CM | POA: Diagnosis not present

## 2021-01-22 DIAGNOSIS — H1045 Other chronic allergic conjunctivitis: Secondary | ICD-10-CM | POA: Diagnosis not present

## 2021-01-22 DIAGNOSIS — H43813 Vitreous degeneration, bilateral: Secondary | ICD-10-CM | POA: Diagnosis not present

## 2021-01-22 DIAGNOSIS — H35373 Puckering of macula, bilateral: Secondary | ICD-10-CM | POA: Diagnosis not present

## 2021-01-22 DIAGNOSIS — H0288A Meibomian gland dysfunction right eye, upper and lower eyelids: Secondary | ICD-10-CM | POA: Diagnosis not present

## 2021-01-22 DIAGNOSIS — H532 Diplopia: Secondary | ICD-10-CM | POA: Diagnosis not present

## 2021-01-24 DIAGNOSIS — K136 Irritative hyperplasia of oral mucosa: Secondary | ICD-10-CM | POA: Diagnosis not present

## 2021-01-31 DIAGNOSIS — M6281 Muscle weakness (generalized): Secondary | ICD-10-CM | POA: Diagnosis not present

## 2021-01-31 DIAGNOSIS — M62838 Other muscle spasm: Secondary | ICD-10-CM | POA: Diagnosis not present

## 2021-01-31 DIAGNOSIS — R350 Frequency of micturition: Secondary | ICD-10-CM | POA: Diagnosis not present

## 2021-01-31 DIAGNOSIS — R351 Nocturia: Secondary | ICD-10-CM | POA: Diagnosis not present

## 2021-01-31 DIAGNOSIS — N3946 Mixed incontinence: Secondary | ICD-10-CM | POA: Diagnosis not present

## 2021-01-31 DIAGNOSIS — M6289 Other specified disorders of muscle: Secondary | ICD-10-CM | POA: Diagnosis not present

## 2021-02-06 DIAGNOSIS — H524 Presbyopia: Secondary | ICD-10-CM | POA: Diagnosis not present

## 2021-02-06 DIAGNOSIS — H10413 Chronic giant papillary conjunctivitis, bilateral: Secondary | ICD-10-CM | POA: Diagnosis not present

## 2021-02-12 DIAGNOSIS — F419 Anxiety disorder, unspecified: Secondary | ICD-10-CM | POA: Diagnosis not present

## 2021-02-12 DIAGNOSIS — M4186 Other forms of scoliosis, lumbar region: Secondary | ICD-10-CM | POA: Diagnosis not present

## 2021-02-12 DIAGNOSIS — M545 Low back pain, unspecified: Secondary | ICD-10-CM | POA: Diagnosis not present

## 2021-02-12 DIAGNOSIS — F330 Major depressive disorder, recurrent, mild: Secondary | ICD-10-CM | POA: Diagnosis not present

## 2021-02-12 DIAGNOSIS — G4700 Insomnia, unspecified: Secondary | ICD-10-CM | POA: Diagnosis not present

## 2021-02-20 ENCOUNTER — Other Ambulatory Visit: Payer: Self-pay

## 2021-02-20 ENCOUNTER — Encounter: Payer: Self-pay | Admitting: Sports Medicine

## 2021-02-20 ENCOUNTER — Ambulatory Visit (INDEPENDENT_AMBULATORY_CARE_PROVIDER_SITE_OTHER): Payer: Medicare Other | Admitting: Sports Medicine

## 2021-02-20 VITALS — BP 128/82 | Ht 66.25 in | Wt 162.0 lb

## 2021-02-20 DIAGNOSIS — M79673 Pain in unspecified foot: Secondary | ICD-10-CM

## 2021-02-20 NOTE — Progress Notes (Signed)
Patient ID: Misty [name], female   DOB: 10-Aug-1951, 69 y.o.   MRN: [id number]  Patient comes in today for [place] to evaluate her custom orthotics.  Custom orthotics were made in June 2019.  She alternates between those and a pair of green sports insoles with scaphoid pads.  Her main issue is a feeling of fullness or tightness in her shoes with the orthotics in place.  She also recently had some back pain and it was recommended that she bring her orthotics in for [place] to evaluate.  She has a history of plantar fasciitis but no plantar fascial pain today.  She does occasionally get pain on the dorsum of her foot due to her cavus foot and some midfoot DJD.  I inspected her custom orthotics.  They are still in good shape.  I recommended that we try lacing her tennis shoes a little different to see if that will help with the feeling of fullness.  She is also set to purchase new shoes tomorrow at [employer].  She will take her custom orthotics with her.  I explained that the only real reason to make her new custom orthotics would be for the additional cushioning as orthotics do lose cushioning as time goes on.  However, if her back pain is improving, then I would wait on this for now.  She will follow-up as needed.

## 2021-02-21 DIAGNOSIS — M47816 Spondylosis without myelopathy or radiculopathy, lumbar region: Secondary | ICD-10-CM | POA: Diagnosis not present

## 2021-02-21 DIAGNOSIS — M419 Scoliosis, unspecified: Secondary | ICD-10-CM | POA: Diagnosis not present

## 2021-03-12 DIAGNOSIS — F419 Anxiety disorder, unspecified: Secondary | ICD-10-CM | POA: Diagnosis not present

## 2021-03-12 DIAGNOSIS — G4700 Insomnia, unspecified: Secondary | ICD-10-CM | POA: Diagnosis not present

## 2021-03-12 DIAGNOSIS — R194 Change in bowel habit: Secondary | ICD-10-CM | POA: Diagnosis not present

## 2021-03-12 DIAGNOSIS — F330 Major depressive disorder, recurrent, mild: Secondary | ICD-10-CM | POA: Diagnosis not present

## 2021-03-13 DIAGNOSIS — M419 Scoliosis, unspecified: Secondary | ICD-10-CM | POA: Diagnosis not present

## 2021-03-13 DIAGNOSIS — M47816 Spondylosis without myelopathy or radiculopathy, lumbar region: Secondary | ICD-10-CM | POA: Diagnosis not present

## 2021-04-17 DIAGNOSIS — D2261 Melanocytic nevi of right upper limb, including shoulder: Secondary | ICD-10-CM | POA: Diagnosis not present

## 2021-04-17 DIAGNOSIS — L570 Actinic keratosis: Secondary | ICD-10-CM | POA: Diagnosis not present

## 2021-04-17 DIAGNOSIS — L819 Disorder of pigmentation, unspecified: Secondary | ICD-10-CM | POA: Diagnosis not present

## 2021-04-17 DIAGNOSIS — L814 Other melanin hyperpigmentation: Secondary | ICD-10-CM | POA: Diagnosis not present

## 2021-04-17 DIAGNOSIS — D1801 Hemangioma of skin and subcutaneous tissue: Secondary | ICD-10-CM | POA: Diagnosis not present

## 2021-04-17 DIAGNOSIS — L821 Other seborrheic keratosis: Secondary | ICD-10-CM | POA: Diagnosis not present

## 2021-04-17 DIAGNOSIS — D225 Melanocytic nevi of trunk: Secondary | ICD-10-CM | POA: Diagnosis not present

## 2021-04-23 DIAGNOSIS — R194 Change in bowel habit: Secondary | ICD-10-CM | POA: Diagnosis not present

## 2021-04-23 DIAGNOSIS — F330 Major depressive disorder, recurrent, mild: Secondary | ICD-10-CM | POA: Diagnosis not present

## 2021-04-23 DIAGNOSIS — M5090 Cervical disc disorder, unspecified, unspecified cervical region: Secondary | ICD-10-CM | POA: Diagnosis not present

## 2021-04-23 DIAGNOSIS — G4700 Insomnia, unspecified: Secondary | ICD-10-CM | POA: Diagnosis not present

## 2021-04-23 DIAGNOSIS — F419 Anxiety disorder, unspecified: Secondary | ICD-10-CM | POA: Diagnosis not present

## 2021-04-23 DIAGNOSIS — R519 Headache, unspecified: Secondary | ICD-10-CM | POA: Diagnosis not present

## 2021-05-15 DIAGNOSIS — Z01419 Encounter for gynecological examination (general) (routine) without abnormal findings: Secondary | ICD-10-CM | POA: Diagnosis not present

## 2021-05-15 DIAGNOSIS — N952 Postmenopausal atrophic vaginitis: Secondary | ICD-10-CM | POA: Diagnosis not present

## 2021-05-15 DIAGNOSIS — M8588 Other specified disorders of bone density and structure, other site: Secondary | ICD-10-CM | POA: Diagnosis not present

## 2021-05-15 DIAGNOSIS — N958 Other specified menopausal and perimenopausal disorders: Secondary | ICD-10-CM | POA: Diagnosis not present

## 2021-05-15 DIAGNOSIS — Z6827 Body mass index (BMI) 27.0-27.9, adult: Secondary | ICD-10-CM | POA: Diagnosis not present

## 2021-05-15 DIAGNOSIS — Z7689 Persons encountering health services in other specified circumstances: Secondary | ICD-10-CM | POA: Diagnosis not present

## 2021-05-15 DIAGNOSIS — Z1231 Encounter for screening mammogram for malignant neoplasm of breast: Secondary | ICD-10-CM | POA: Diagnosis not present

## 2021-05-15 DIAGNOSIS — M8580 Other specified disorders of bone density and structure, unspecified site: Secondary | ICD-10-CM | POA: Diagnosis not present

## 2021-07-23 DIAGNOSIS — M545 Low back pain, unspecified: Secondary | ICD-10-CM | POA: Diagnosis not present

## 2021-08-14 ENCOUNTER — Encounter: Payer: Self-pay | Admitting: *Deleted

## 2021-08-15 ENCOUNTER — Ambulatory Visit (INDEPENDENT_AMBULATORY_CARE_PROVIDER_SITE_OTHER): Payer: Medicare Other | Admitting: Neurology

## 2021-08-15 ENCOUNTER — Encounter: Payer: Self-pay | Admitting: Neurology

## 2021-08-15 ENCOUNTER — Other Ambulatory Visit: Payer: Self-pay

## 2021-08-15 VITALS — BP 157/91 | HR 71 | Ht 66.0 in | Wt 170.0 lb

## 2021-08-15 DIAGNOSIS — M542 Cervicalgia: Secondary | ICD-10-CM | POA: Diagnosis not present

## 2021-08-15 DIAGNOSIS — R400 Somnolence: Secondary | ICD-10-CM | POA: Diagnosis not present

## 2021-08-15 DIAGNOSIS — H539 Unspecified visual disturbance: Secondary | ICD-10-CM | POA: Diagnosis not present

## 2021-08-15 DIAGNOSIS — R51 Headache with orthostatic component, not elsewhere classified: Secondary | ICD-10-CM | POA: Diagnosis not present

## 2021-08-15 DIAGNOSIS — R519 Headache, unspecified: Secondary | ICD-10-CM | POA: Diagnosis not present

## 2021-08-15 DIAGNOSIS — G8929 Other chronic pain: Secondary | ICD-10-CM | POA: Diagnosis not present

## 2021-08-15 NOTE — Patient Instructions (Signed)
MRI brain w/wo contrast Sleep evaluation for OSA  Migraine Headache A migraine headache is an intense, throbbing pain on one side or both sides of the head. Migraine headaches may also cause other symptoms, such as nausea, vomiting, and sensitivity to light and noise. A migraine headache can last from 4 hours to 3 days. Talk with your doctor about what things may bring on (trigger) your migraine headaches. What are the causes? The exact cause of this condition is not known. However, a migraine may be caused when nerves in the brain become irritated and release chemicals that cause inflammation of blood vessels. This inflammation causes pain. This condition may be triggered or caused by: Drinking alcohol. Smoking. Taking medicines, such as: Medicine used to treat chest pain (nitroglycerin). Birth control pills. Estrogen. Certain blood pressure medicines. Eating or drinking products that contain nitrates, glutamate, aspartame, or tyramine. Aged cheeses, chocolate, or caffeine may also be triggers. Doing physical activity. Other things that may trigger a migraine headache include: Menstruation. Pregnancy. Hunger. Stress. Lack of sleep or too much sleep. Weather changes. Fatigue. What increases the risk? The following factors may make you more likely to experience migraine headaches: Being a certain age. This condition is more common in people who are 103-95 years old. Being female. Having a family history of migraine headaches. Being Caucasian. Having a mental health condition, such as depression or anxiety. Being obese. What are the signs or symptoms? The main symptom of this condition is pulsating or throbbing pain. This pain may: Happen in any area of the head, such as on one side or both sides. Interfere with daily activities. Get worse with physical activity. Get worse with exposure to bright lights or loud noises. Other symptoms may  include: Nausea. Vomiting. Dizziness. General sensitivity to bright lights, loud noises, or smells. Before you get a migraine headache, you may get warning signs (an aura). An aura may include: Seeing flashing lights or having blind spots. Seeing bright spots, halos, or zigzag lines. Having tunnel vision or blurred vision. Having numbness or a tingling feeling. Having trouble talking. Having muscle weakness. Some people have symptoms after a migraine headache (postdromal phase), such as: Feeling tired. Difficulty concentrating. How is this diagnosed? A migraine headache can be diagnosed based on: Your symptoms. A physical exam. Tests, such as: CT scan or an MRI of the head. These imaging tests can help rule out other causes of headaches. Taking fluid from the spine (lumbar puncture) and analyzing it (cerebrospinal fluid analysis, or CSF analysis). How is this treated? This condition may be treated with medicines that: Relieve pain. Relieve nausea. Prevent migraine headaches. Treatment for this condition may also include: Acupuncture. Lifestyle changes like avoiding foods that trigger migraine headaches. Biofeedback. Cognitive behavioral therapy. Follow these instructions at home: Medicines Take over-the-counter and prescription medicines only as told by your health care provider. Ask your health care provider if the medicine prescribed to you: Requires you to avoid driving or using heavy machinery. Can cause constipation. You may need to take these actions to prevent or treat constipation: Drink enough fluid to keep your urine pale yellow. Take over-the-counter or prescription medicines. Eat foods that are high in fiber, such as beans, whole grains, and fresh fruits and vegetables. Limit foods that are high in fat and processed sugars, such as fried or sweet foods. Lifestyle Do not drink alcohol. Do not use any products that contain nicotine or tobacco, such as cigarettes,  e-cigarettes, and chewing tobacco. If you need help quitting,  ask your health care provider. Get at least 8 hours of sleep every night. Find ways to manage stress, such as meditation, deep breathing, or yoga. General instructions     Keep a journal to find out what may trigger your migraine headaches. For example, write down: What you eat and drink. How much sleep you get. Any change to your diet or medicines. If you have a migraine headache: Avoid things that make your symptoms worse, such as bright lights. It may help to lie down in a dark, quiet room. Do not drive or use heavy machinery. Ask your health care provider what activities are safe for you while you are experiencing symptoms. Keep all follow-up visits as told by your health care provider. This is important. Contact a health care provider if: You develop symptoms that are different or more severe than your usual migraine headache symptoms. You have more than 15 headache days in one month. Get help right away if: Your migraine headache becomes severe. Your migraine headache lasts longer than 72 hours. You have a fever. You have a stiff neck. You have vision loss. Your muscles feel weak or like you cannot control them. You start to lose your balance often. You have trouble walking. You faint. You have a seizure. Summary A migraine headache is an intense, throbbing pain on one side or both sides of the head. Migraines may also cause other symptoms, such as nausea, vomiting, and sensitivity to light and noise. This condition may be treated with medicines and lifestyle changes. You may also need to avoid certain things that trigger a migraine headache. Keep a journal to find out what may trigger your migraine headaches. Contact your health care provider if you have more than 15 headache days in a month or you develop symptoms that are different or more severe than your usual migraine headache symptoms. This information is  not intended to replace advice given to you by your health care provider. Make sure you discuss any questions you have with your healthcare provider. Document Revised: 04/09/2019 Document Reviewed: 01/28/2019 Elsevier Patient Education  [place]. Sleep Apnea Sleep apnea is a condition in which breathing pauses or becomes shallow during sleep. People with sleep apnea usually snore loudly. They may have times when they gasp and stop breathing for 10 seconds or more during sleep. This mayhappen many times during the night. Sleep apnea disrupts your sleep and keeps your body from getting the rest that it needs. This condition can increase your risk of certain health problems, including: Heart attack. Stroke. Obesity. Type 2 diabetes. Heart failure. Irregular heartbeat. High blood pressure. The goal of treatment is to help you breathe normally again. What are the causes? The most common cause of sleep apnea is a collapsed or blocked airway. There are three kinds of sleep apnea: Obstructive sleep apnea. This kind is caused by a blocked or collapsed airway. Central sleep apnea. This kind happens when the part of the brain that controls breathing does not send the correct signals to the muscles that control breathing. Mixed sleep apnea. This is a combination of obstructive and central sleep apnea. What increases the risk? You are more likely to develop this condition if you: Are overweight. Smoke. Have a smaller than normal airway. Are older. Are female. Drink alcohol. Take sedatives or tranquilizers. Have a family history of sleep apnea. Have a tongue or tonsils that are larger than normal. What are the signs or symptoms? Symptoms of this condition include: Trouble  staying asleep. Loud snoring. Morning headaches. Waking up gasping. Dry mouth or sore throat in the morning. Daytime sleepiness and tiredness. If you have daytime fatigue because of sleep apnea, you may be more  likely to have: Trouble concentrating. Forgetfulness. Irritability or mood swings. Personality changes. Feelings of depression. Sexual dysfunction. This may include loss of interest if you are female, or erectile dysfunction if you are female. How is this diagnosed? This condition may be diagnosed with: A medical history. A physical exam. A series of tests that are done while you are sleeping (sleep study). These tests are usually done in a sleep lab, but they may also be done at home. How is this treated? Treatment for this condition aims to restore normal breathing and to ease symptoms during sleep. It may involve managing health issues that can affect breathing, such as high blood pressure or obesity. Treatment may include: Sleeping on your side. Using a decongestant if you have nasal congestion. Avoiding the use of depressants, including alcohol, sedatives, and narcotics. Losing weight if you are overweight. Making changes to your diet. Quitting smoking. Using a device to open your airway while you sleep, such as: An oral appliance. This is a custom-made mouthpiece that shifts your lower jaw forward. A continuous positive airway pressure (CPAP) device. This device blows air through a mask when you breathe out (exhale). A nasal expiratory positive airway pressure (EPAP) device. This device has valves that you put into each nostril. A bi-level positive airway pressure (BPAP) device. This device blows air through a mask when you breathe in (inhale) and breathe out (exhale). Having surgery if other treatments do not work. During surgery, excess tissue is removed to create a wider airway. Follow these instructions at home: Lifestyle Make any lifestyle changes that your health care provider recommends. Eat a healthy, well-balanced diet. Take steps to lose weight if you are overweight. Avoid using depressants, including alcohol, sedatives, and narcotics. Do not use any products that  contain nicotine or tobacco. These products include cigarettes, chewing tobacco, and vaping devices, such as e-cigarettes. If you need help quitting, ask your health care provider. General instructions Take over-the-counter and prescription medicines only as told by your health care provider. If you were given a device to open your airway while you sleep, use it only as told by your health care provider. If you are having surgery, make sure to tell your health care provider you have sleep apnea. You may need to bring your device with you. Keep all follow-up visits. This is important. Contact a health care provider if: The device that you received to open your airway during sleep is uncomfortable or does not seem to be working. Your symptoms do not improve. Your symptoms get worse. Get help right away if: You develop: Chest pain. Shortness of breath. Discomfort in your back, arms, or stomach. You have: Trouble speaking. Weakness on one side of your body. Drooping in your face. These symptoms may represent a serious problem that is an emergency. Do not wait to see if the symptoms will go away. Get medical help right away. Call your local emergency services (911 in the [place] Do not drive yourself to the hospital. Summary Sleep apnea is a condition in which breathing pauses or becomes shallow during sleep. The most common cause is a collapsed or blocked airway. The goal of treatment is to restore normal breathing and to ease symptoms during sleep. This information is not intended to replace advice given to you by  your health care provider. Make sure you discuss any questions you have with your healthcare provider. Document Revised: 11/24/2020 Document Reviewed: 11/24/2020 Elsevier Patient Education  2022 [employer].

## 2021-08-15 NOTE — Progress Notes (Addendum)
[facility]    Provider:  Dr Jaynee Eagles Requesting Provider: [name], MD Primary Care Provider:  [name], MD  CC:  headache  HPI:  Misty [name] is a 70 y.o. female here as requested by [name], MD for headaches. Started a year ago, she used to have migraines when she was younger. Last summer she was on effexor and she had tapered it in July of last year she started not being able to sleep she saw her doctor in November, she started taking xanax at bedtime with trazodone, this is in 2021. In January in 2022 she started feeling a little blue, she tried Lexapro and she woke up with a blinding headache that lasted for days. She tried Zoloft after that and she stopped xanax. She then changed to Buspar May 2022 and the headaches still persisted but not as bad as below, always similar to prior migraines, a little sound and light sensitivity, throbbing always in the forehead, no nausea. She has headaches 3x a week and can wake with them. She does not feel refreshed, wakes with headaches. She fell 4 years ago on her nose. Se has had some vision changes and she has gone through a year of changing glasses it was strange, decreased vision in the right eye.   2-3x a week, at least 1x a week she wakes up with them, still waking frequently at night, she sleeps in the guest bedroom, toss and turn all night, usually 1/2 excedrin migraine will stop it, some light sound and sound sensitivity, she has cut off caffeine, no increased alcohol. No known triggers. She doesn't feel rested in the morning and they can also start late morning as well, mild to moderate in pain. No other focal neurologic deficits, associated symptoms, inciting events or modifiable factors.   Reviewed notes, labs and imaging from outside physicians, which showed:  All CT scans at [facility] and [facility] are performed using dose optimization techniques as  appropriate to a performed exam, including but not limited to one or more of the following: automated exposure control, adjustment of the mA and/or kV according to patient size, use of iterative reconstruction technique. In addition, [facility] is participating in the [place] program which will further assist [place] in optimizing patient radiation exposure.    CT head 08/08/2017:   .  Calvarium/skull base: No evidence of acute fracture or destructive lesion. No substantial disease in the mastoids or middle ears.  .  Paranasal sinuses: Small amount of fluid right maxillary sinus.  .  Brain: No evidence of acute large vascular territory infarct. No mass effect, mass lesion, or hydrocephalus. No acute hemorrhage. Bilateral nasal bone fractures. Slight displacement to the left  Review of Systems: Patient complains of symptoms per HPI as well as the following symptoms morning headache. Pertinent negatives and positives per HPI. All others negative.   Social History   Socioeconomic History   Marital status: Married    Spouse name: Not on file   Number of children: 0   Years of education: Not on file   Highest education level: Not on file  Occupational History   Occupation: non Airline pilot  Tobacco Use   Smoking status: Never   Smokeless tobacco: Never  Vaping Use   Vaping Use: Never used  Substance and Sexual Activity   Alcohol use: Yes    Comment: occ   Drug use: No   Sexual activity: Yes  Partners: Male  Other Topics Concern   Not on file  Social History Narrative   Not on file   Social Determinants of Health   Financial Resource Strain: Not on file  Food Insecurity: Not on file  Transportation Needs: Not on file  Physical Activity: Not on file  Stress: Not on file  Social Connections: Not on file  Intimate Partner Violence: Not on file    Family History  Problem Relation Age of Onset   Dementia Mother    Hypertension Mother    Glaucoma Mother    Heart  disease Father    Rheum arthritis Father    Lupus Father    Aneurysm Father    CAD Father    Hypertension Father    Hyperlipidemia Father    Stroke Maternal Grandfather    Breast cancer Neg Hx    Colon cancer Neg Hx    Stomach cancer Neg Hx    Esophageal cancer Neg Hx    Rectal cancer Neg Hx    Migraines Neg Hx     Past Medical History:  Diagnosis Date   Allergy    Anxiety    Depression    GERD (gastroesophageal reflux disease)    GERD (gastroesophageal reflux disease)    Headache    Hiatal hernia    Hypercholesteremia    Insomnia    Osteopenia    Plantar fascia syndrome    Scoliosis    chronic pain    Patient Active Problem List   Diagnosis Date Noted   Plantar fasciitis of left foot 03/24/2018   Asthmatic bronchitis without complication [id number]   Seasonal and perennial allergic rhinitis 11/13/2014    Past Surgical History:  Procedure Laterality Date   COLONOSCOPY     excision of morton's neuroma     [place]    Current Outpatient Medications  Medication Sig Dispense Refill   Cholecalciferol (VITAMIN D) 125 MCG (5000 UT) CAPS Take 5,000 Units by mouth every other day.     esomeprazole (NEXIUM) 40 MG capsule Take 1 capsule by mouth daily.     ezetimibe (ZETIA) 10 MG tablet Take 10 mg by mouth daily.     Magnesium Gluconate 550 MG TABS Take by mouth.     Multiple Vitamins-Minerals (MULTIVITAMIN PO) Take 1 tablet by mouth daily.     traZODone (DESYREL) 50 MG tablet Take 50 mg by mouth at bedtime as needed.     vitamin B-12 (CYANOCOBALAMIN) 500 MCG tablet Take by mouth.     Current Facility-Administered Medications  Medication Dose Route Frequency Provider Last Rate Last Admin   0.9 %  sodium chloride infusion  500 mL Intravenous Once Thornton Park, MD        Allergies as of 08/15/2021 - Review Complete 08/15/2021  Allergen Reaction Noted   Dust mite extract  08/14/2021   Latex Hives  08/11/2017   Macrobid  [nitrofurantoin macrocrystal]  06/09/2019   Molds & smuts  08/14/2021    Vitals: BP (!) 157/91   Pulse 71   Ht '5\' 6"'$  (1.676 m)   Wt 170 lb (77.1 kg)   LMP  (LMP Unknown)   BMI 27.44 kg/m  Last Weight:  Wt Readings from Last 1 Encounters:  08/15/21 170 lb (77.1 kg)   Last Height:   Ht Readings from Last 1 Encounters:  08/15/21 '5\' 6"'$  (1.676 m)     Physical exam: Exam: Gen:  NAD, conversant, well nourised, well groomed                     CV: RRR, no MRG. No Carotid Bruits. No peripheral edema, warm, nontender Eyes: Conjunctivae clear without exudates or hemorrhage  Neuro: Detailed Neurologic Exam  Speech:    Speech is normal; fluent and spontaneous with normal comprehension.  Cognition:    The patient is oriented to person, place, and time;     recent and remote memory intact;     language fluent;     normal attention, concentration,     fund of knowledge Cranial Nerves:    The pupils are equal, round, and reactive to light. The fundi are flat Visual fields are full to finger confrontation. Extraocular movements are intact. Trigeminal sensation is intact and the muscles of mastication are normal. The face is symmetric. The palate elevates in the midline. Hearing intact. Voice is normal. Shoulder shrug is normal. The tongue has normal motion without fasciculations.   Coordination:    Normal   Gait:    normal.   Motor Observation:    No asymmetry, no atrophy, and no involuntary movements noted. Tone:    Normal muscle tone.    Posture:    Posture is normal. normal erect    Strength:    Strength is V/V in the upper and lower limbs.      Sensation: intact to LT     Reflex Exam:  DTR's:    Deep tendon reflexes in the upper and lower extremities are normal bilaterally.   Toes:    The toes are downgoing bilaterally.   Clonus:    Clonus is absent.    Assessment/Plan:  Patient with likely migraines however given concerning symptoms  needs evaluation  Morning headache, daytime somnolence, doesn't feel refreshed waking up: Sleep evaluation  MRI of the brain w/wo contrast: MRI brain due to concerning symptoms of morning headaches, positional headaches,vision changes  to look for space occupying mass, chiari or intracranial hypertension (pseudotumor), stroke or other  Discussed: To prevent or relieve headaches, try the following: Cool Compress. Lie down and place a cool compress on your head.  Avoid headache triggers. If certain foods or odors seem to have triggered your migraines in the past, avoid them. A headache diary might help you identify triggers.  Include physical activity in your daily routine. Try a daily walk or other moderate aerobic exercise.  Manage stress. Find healthy ways to cope with the stressors, such as delegating tasks on your to-do list.  Practice relaxation techniques. Try deep breathing, yoga, massage and visualization.  Eat regularly. Eating regularly scheduled meals and maintaining a healthy diet might help prevent headaches. Also, drink plenty of fluids.  Follow a regular sleep schedule. Sleep deprivation might contribute to headaches Consider biofeedback. With this mind-body technique, you learn to control certain bodily functions -- such as muscle tension, heart rate and blood pressure -- to prevent headaches or reduce headache pain.    Proceed to emergency room if you experience new or worsening symptoms or symptoms do not resolve, if you have new neurologic symptoms or if headache is severe, or for any concerning symptom.   Provided education and documentation from [employer] toolbox including articles on: chronic migraine medication overuse headache, chronic migraines, prevention of migraines, behavioral and other nonpharmacologic treatments for headache.   Patient repprts chronic neck nack and is asking if we can order an MRI cervical spine. Will try to  order.      Orders  Placed This Encounter  Procedures   MR BRAIN W WO CONTRAST   MR CERVICAL SPINE [place]   Ambulatory referral to Sleep Studies   No orders of the defined types were placed in this encounter.   Cc: [name], MD,  [name], MD  Sarina Ill, MD  [facility] [address] [place], [place] [address]  Phone [phone] Fax [phone]

## 2021-08-16 ENCOUNTER — Telehealth: Payer: Self-pay | Admitting: Neurology

## 2021-08-16 NOTE — Telephone Encounter (Signed)
Medicare/bcbs supp order sent to [facility]. [employer] they will reach out to the patient to schedule.

## 2021-08-25 NOTE — Addendum Note (Signed)
Addended by: Sarina Ill B on: 08/25/2021 01:27 PM   Modules accepted: Orders

## 2021-08-27 ENCOUNTER — Telehealth: Payer: Self-pay | Admitting: Neurology

## 2021-08-27 NOTE — Telephone Encounter (Signed)
Medicare/BCBS supp order sent to [facility]. [employer] they will reach out to the patient to schedule.

## 2021-08-31 ENCOUNTER — Encounter: Payer: Self-pay | Admitting: Neurology

## 2021-09-05 ENCOUNTER — Telehealth (INDEPENDENT_AMBULATORY_CARE_PROVIDER_SITE_OTHER): Payer: Medicare Other | Admitting: Neurology

## 2021-09-05 ENCOUNTER — Encounter: Payer: Self-pay | Admitting: Neurology

## 2021-09-05 DIAGNOSIS — R0681 Apnea, not elsewhere classified: Secondary | ICD-10-CM

## 2021-09-05 DIAGNOSIS — G478 Other sleep disorders: Secondary | ICD-10-CM

## 2021-09-05 DIAGNOSIS — Z73819 Behavioral insomnia of childhood, unspecified type: Secondary | ICD-10-CM

## 2021-09-05 DIAGNOSIS — R519 Headache, unspecified: Secondary | ICD-10-CM

## 2021-09-05 NOTE — Telephone Encounter (Signed)
Spoke w/ pt. She is having troulble w/ mychart VV. On phone now w/ help desk. They are trying to fix for her. Per Dr. Brett Fairy, plan on logging on at 430p instead.
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
## 2021-09-10 ENCOUNTER — Other Ambulatory Visit: Payer: Medicare Other

## 2021-09-24 ENCOUNTER — Ambulatory Visit (INDEPENDENT_AMBULATORY_CARE_PROVIDER_SITE_OTHER): Payer: Medicare Other | Admitting: Neurology

## 2021-09-24 DIAGNOSIS — G4733 Obstructive sleep apnea (adult) (pediatric): Secondary | ICD-10-CM | POA: Diagnosis not present

## 2021-09-24 DIAGNOSIS — R519 Headache, unspecified: Secondary | ICD-10-CM

## 2021-09-24 DIAGNOSIS — Z73819 Behavioral insomnia of childhood, unspecified type: Secondary | ICD-10-CM

## 2021-09-24 DIAGNOSIS — R0681 Apnea, not elsewhere classified: Secondary | ICD-10-CM

## 2021-09-24 DIAGNOSIS — G478 Other sleep disorders: Secondary | ICD-10-CM

## 2021-09-25 ENCOUNTER — Other Ambulatory Visit: Payer: Medicare Other

## 2021-09-25 ENCOUNTER — Telehealth: Payer: Self-pay | Admitting: Neurology

## 2021-09-25 ENCOUNTER — Ambulatory Visit
Admission: RE | Admit: 2021-09-25 | Discharge: 2021-09-25 | Disposition: A | Discharge status: Home / Self Care | Payer: Medicare Other | Source: Ambulatory Visit | Attending: Neurology | Admitting: Neurology

## 2021-09-25 ENCOUNTER — Other Ambulatory Visit: Payer: Self-pay

## 2021-09-25 DIAGNOSIS — M542 Cervicalgia: Secondary | ICD-10-CM | POA: Diagnosis not present

## 2021-09-25 DIAGNOSIS — H539 Unspecified visual disturbance: Secondary | ICD-10-CM

## 2021-09-25 DIAGNOSIS — R51 Headache with orthostatic component, not elsewhere classified: Secondary | ICD-10-CM

## 2021-09-25 DIAGNOSIS — G8929 Other chronic pain: Secondary | ICD-10-CM

## 2021-09-25 DIAGNOSIS — R519 Headache, unspecified: Secondary | ICD-10-CM | POA: Diagnosis not present

## 2021-09-25 DIAGNOSIS — R400 Somnolence: Secondary | ICD-10-CM

## 2021-09-25 MED ORDER — GADOBENATE DIMEGLUMINE 529 MG/ML IV SOLN
15.0000 mL | Freq: Once | INTRAVENOUS | Status: AC | PRN
  Administered 2021-09-25: 15 mL via INTRAVENOUS

## 2021-09-25 NOTE — Telephone Encounter (Signed)
Pt brought in a list of medications that she has tried and failed in the past for the treatment of her insomnia. She has tried Halcion, temazepam 15 mg, [place]e( Flurazepam), serax 10 mg ((oxazepam), sertaline, sinequan, at [facility] she completed behavioal studies followed by trazadone, [place] (Quazepam).   All of these were tried over a time span since 1989-1993.

## 2021-09-27 NOTE — Progress Notes (Signed)
[facility] [place] TEST REPORT ( by Watch PAT)   STUDY DATE:  09-27-2021 DOB: 1951/06/02  MRN: [id number]   ORDERING CLINICIAN: Larey Seat, MD  REFERRING CLINICIAN: Sarina Ill , MD    CLINICAL INFORMATION/HISTORY: 09-05-2021, virtual visit. 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 She keeps good sleep hygiene, has no RLS, and doesn't use alcohol. Her husband once now has noticed very mild snoring and stated that this was an exception.  But he has witnessed apnea when she fell asleep watching TV.     Trazodone was started 1990, was increased to 100 mg by Dr Ardeth Perfect in 2021, who also added xanax in 10-2020. She  is now using 50 mg trazodone as higher dose did not add any benefit.   Started Effexor  2001, was changed to [place], and changed back to Effexor - d/c 2006 and restarted 2010, weaned again in 2021- due to involuntary movements.   Now insomnia in form of sleep fragmentation but not with sleep initiation, has resurfaced after she gently weaned off Effexor.  Her sleep is no longer restorative.  She experienced " splitting headaches" since 01-2021, after she tried Lexapro -and has not been able to find HA relief.    Epworth sleepiness score: 04 /24. FSS at 40/63 points    BMI: 27 kg/m   Neck Circumference: 14.5"   FINDINGS:   Sleep Summary:   Total Recording Time (hours, min): The total recording time of this home sleep test amounted to 8 hours and 6 minutes of which 6 hours and 41 minutes were calculated total sleep time.        Percent REM (%): Calculated REM sleep time amounted to 23.7% of total sleep time.                                        Respiratory Indices:   Calculated pAHI (per hour): The apnea hypopnea index for the total study was 15.8/h and much higher in REM sleep than non-REM sleep.   During REM sleep the AHI amounted to 42.4/h of sleep  and the non-REM sleep AHI was only 7.4/h of sleep.                                              Supine AHI: Positional data indicate supine AHI to be 46.1/h versus a non-REM supine sleep AHI at 10.8/h.   The patient had a slightly lower AHI in the right lateral sleep compared to sleep in the left sided sleep position.  Snoring data indicate moderately -loud snoring with a mean volume of 43 dB, according to the chest wall electrode 70% of sleep were associated with snoring.                                            Oxygen Saturation Statistics:          O2 Saturation Range (%):   Oxygen saturation varies between a nadir at 84% and a maximum of 98% with a mean saturation at  94%.                                    O2 Saturation (minutes) <89%: Time in hypoxia was calculated at 2.2 minutes only which is the equivalent of 8.5% of total sleep time and would be deemed clinically not relevant.          Pulse Rate Statistics:         Pulse Range: Pulse ranged between 50 and 106 bpm with a mean heart rate of 60 bpm.  The highest heart rate variability was seen during REM sleep periods. Please note that this device cannot differentiate different cardiac rhythms and only gives cardiac rate data.               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  Hypoxia and heart rate data were not abnormal.   The chest wall electrode indicated that snoring is present for much of the night and is not mild by volume. Sleep was most fragmented after 5. 30 AM.   RECOMMENDATION: REM sleep dependent apnea would not respond to a dental device or an inspire device and is most effectively treated with CPAP.  I would like to place the patient on an auto titration CPAP, with a setting between 6 and 16 cm water pressure, 2 cm EPR and a interface of her own choosing.  Heated humidification will be provided.    INTERPRETING PHYSICIAN:   Larey Seat, MD   Medical Director of [facility].

## 2021-09-28 DIAGNOSIS — Z23 Encounter for immunization: Secondary | ICD-10-CM | POA: Diagnosis not present

## 2021-10-12 NOTE — Procedures (Signed)
HOME SLEEP TEST REPORT ( by Watch PAT)   STUDY DATE:  09-27-2021 DOB: 1951-05-28  MRN: [id number]   ORDERING CLINICIAN: Larey Seat, MD  REFERRING CLINICIAN: Sarina Ill , MD    CLINICAL INFORMATION/HISTORY: 09-05-2021, virtual visit. 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 She keeps good sleep hygiene, has no RLS, and doesn't use alcohol. Her husband once now has noticed very mild snoring and stated that this was an exception.  But he has witnessed apnea when she fell asleep watching TV.     Trazodone was started 1990, was increased to 100 mg by Dr Ardeth Perfect in 2021, who also added xanax in 10-2020. She  is now using 50 mg trazodone as higher dose did not add any benefit.   Started Effexor  2001, was changed to [place], and changed back to Effexor - d/c 2006 and restarted 2010, weaned again in 2021- due to involuntary movements.   Now insomnia in form of sleep fragmentation but not with sleep initiation, has resurfaced after she gently weaned off Effexor.  Her sleep is no longer restorative.  She experienced " splitting headaches" since 01-2021, after she tried Lexapro -and has not been able to find HA relief.    Epworth sleepiness score: 04 /24. FSS at 40/63 points    BMI: 27 kg/m   Neck Circumference: 14.5"   FINDINGS:   Sleep Summary:   Total Recording Time (hours, min): The total recording time of this home sleep test amounted to 8 hours and 6 minutes of which 6 hours and 41 minutes were calculated total sleep time.        Percent REM (%): Calculated REM sleep time amounted to 23.7% of total sleep time.                                        Respiratory Indices:   Calculated pAHI (per hour): The apnea hypopnea index for the total study was 15.8/h and much higher in REM sleep than non-REM sleep.   During REM sleep the AHI amounted to 42.4/h of sleep and the non-REM sleep AHI was only  7.4/h of sleep.                                              Supine AHI: Positional data indicate supine AHI to be 46.1/h versus a non-REM supine sleep AHI at 10.8/h.   The patient had a slightly lower AHI in the right lateral sleep compared to sleep in the left sided sleep position.  Snoring data indicate moderately -loud snoring with a mean volume of 43 dB, according to the chest wall electrode 70% of sleep were associated with snoring.                                            Oxygen Saturation Statistics:          O2 Saturation Range (%):   Oxygen saturation varies between a nadir at 84% and a maximum of 98% with a mean saturation at 94%.  O2 Saturation (minutes) <89%: Time in hypoxia was calculated at 2.2 minutes only which is the equivalent of 8.5% of total sleep time and would be deemed clinically not relevant.          Pulse Rate Statistics:         Pulse Range: Pulse ranged between 50 and 106 bpm with a mean heart rate of 60 bpm.  The highest heart rate variability was seen during REM sleep periods. Please note that this device cannot differentiate different cardiac rhythms and only gives cardiac rate data.               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  Hypoxia and heart rate data were not abnormal.   The chest wall electrode indicated that snoring is present for much of the night and is not mild by volume. Sleep was most fragmented after 5. 30 AM.   RECOMMENDATION: REM sleep dependent apnea would not respond to a dental device or an inspire device and is most effectively treated with CPAP.  I would like to place the patient on an auto titration CPAP, with a setting between 6 and 16 cm water pressure, 2 cm EPR and a interface of her own choosing.  Heated humidification will be provided.    INTERPRETING PHYSICIAN:   Larey Seat, MD   Medical Director of  [facility].

## 2021-10-12 NOTE — Addendum Note (Signed)
Addended by: Larey Seat on: 10/12/2021 01:47 PM   Modules accepted: Orders

## 2021-10-12 NOTE — Progress Notes (Signed)
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  Hypoxia and heart rate data were not abnormal.  Supine sleep was associated with an increase in AHI and snoring. The chest wall electrode indicated that snoring is present for much of the night and is not mild by volume. Sleep was most fragmented after 5. 30 AM.  RECOMMENDATION: REM sleep dependent apnea would not respond to a dental device or an inspire device and is most effectively treated with CPAP.  It is helpful to avoid the supine sleep position to reduce snoring and apnea.   I like to place the patient on an auto-titration capable CPAP device , with a setting between 6 and 16 cm water pressure, 2 cm EPR, and a interface of her own choosing. Heated humidification will be provided.

## 2021-10-15 ENCOUNTER — Telehealth: Payer: Self-pay | Admitting: Neurology

## 2021-10-15 NOTE — Telephone Encounter (Signed)
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  Hypoxia and heart rate data were not abnormal.  Supine sleep was associated with an increase in AHI and snoring. The chest wall electrode indicated that snoring is present for much of the night and is not mild by volume. Sleep was most fragmented after 5. 30 AM.  RECOMMENDATION: REM sleep dependent apnea would not respond to a dental device or an inspire device and is most effectively treated with CPAP.  It is helpful to avoid the supine sleep position to reduce snoring and apnea.   I like to place the patient on an auto-titration capable CPAP device , with a setting between 6 and 16 cm water pressure, 2 cm EPR, and a interface of her own choosing. Heated humidification will be provided.

## 2021-10-15 NOTE — Telephone Encounter (Signed)
I called pt. I advised pt that Dr. Brett Fairy reviewed their sleep study results and found that pt has . Dr. Brett Fairy recommends that pt starts auto CPAP. I reviewed PAP compliance expectations with the pt. Pt is agreeable to starting a CPAP. I advised pt that an order will be sent to a DME, [facility], and [facility] will call the pt within about one week after they file with the pt's insurance. [facility] will show the pt how to use the machine, fit for masks, and troubleshoot the CPAP if needed. A follow up appt was made for insurance purposes with Dr. Brett Fairy on 01/29/2022 at 10:30 am. Pt verbalized understanding to arrive 15 minutes early and bring their CPAP. A letter with all of this information in it will be mailed to the pt as a reminder. I verified with the pt that the address we have on file is correct. Pt verbalized understanding of results. Pt had no questions at this time but was encouraged to call back if questions arise. I have sent the order to [facility] and have received confirmation that they have received the order.

## 2021-10-23 DIAGNOSIS — Z23 Encounter for immunization: Secondary | ICD-10-CM | POA: Diagnosis not present

## 2021-11-01 ENCOUNTER — Encounter: Payer: Self-pay | Admitting: Neurology

## 2021-11-05 DIAGNOSIS — M47816 Spondylosis without myelopathy or radiculopathy, lumbar region: Secondary | ICD-10-CM | POA: Diagnosis not present

## 2021-11-13 ENCOUNTER — Institutional Professional Consult (permissible substitution): Payer: Medicare Other | Admitting: Neurology

## 2021-11-19 DIAGNOSIS — E785 Hyperlipidemia, unspecified: Secondary | ICD-10-CM | POA: Diagnosis not present

## 2021-11-19 DIAGNOSIS — E559 Vitamin D deficiency, unspecified: Secondary | ICD-10-CM | POA: Diagnosis not present

## 2021-11-27 DIAGNOSIS — R030 Elevated blood-pressure reading, without diagnosis of hypertension: Secondary | ICD-10-CM | POA: Diagnosis not present

## 2021-11-27 DIAGNOSIS — R519 Headache, unspecified: Secondary | ICD-10-CM | POA: Diagnosis not present

## 2021-11-27 DIAGNOSIS — F419 Anxiety disorder, unspecified: Secondary | ICD-10-CM | POA: Diagnosis not present

## 2021-11-27 DIAGNOSIS — N3946 Mixed incontinence: Secondary | ICD-10-CM | POA: Diagnosis not present

## 2021-11-27 DIAGNOSIS — E559 Vitamin D deficiency, unspecified: Secondary | ICD-10-CM | POA: Diagnosis not present

## 2021-11-27 DIAGNOSIS — E785 Hyperlipidemia, unspecified: Secondary | ICD-10-CM | POA: Diagnosis not present

## 2021-11-27 DIAGNOSIS — G4700 Insomnia, unspecified: Secondary | ICD-10-CM | POA: Diagnosis not present

## 2021-11-27 DIAGNOSIS — Z0000 Encounter for general adult medical examination without abnormal findings: Secondary | ICD-10-CM | POA: Diagnosis not present

## 2021-11-27 DIAGNOSIS — M7918 Myalgia, other site: Secondary | ICD-10-CM | POA: Diagnosis not present

## 2021-11-27 DIAGNOSIS — Z1339 Encounter for screening examination for other mental health and behavioral disorders: Secondary | ICD-10-CM | POA: Diagnosis not present

## 2021-11-27 DIAGNOSIS — M5090 Cervical disc disorder, unspecified, unspecified cervical region: Secondary | ICD-10-CM | POA: Diagnosis not present

## 2021-11-27 DIAGNOSIS — Z1331 Encounter for screening for depression: Secondary | ICD-10-CM | POA: Diagnosis not present

## 2021-12-05 DIAGNOSIS — L821 Other seborrheic keratosis: Secondary | ICD-10-CM | POA: Diagnosis not present

## 2021-12-05 DIAGNOSIS — D2272 Melanocytic nevi of left lower limb, including hip: Secondary | ICD-10-CM | POA: Diagnosis not present

## 2021-12-05 DIAGNOSIS — L304 Erythema intertrigo: Secondary | ICD-10-CM | POA: Diagnosis not present

## 2021-12-05 DIAGNOSIS — D225 Melanocytic nevi of trunk: Secondary | ICD-10-CM | POA: Diagnosis not present

## 2021-12-05 DIAGNOSIS — D2271 Melanocytic nevi of right lower limb, including hip: Secondary | ICD-10-CM | POA: Diagnosis not present

## 2021-12-05 DIAGNOSIS — L820 Inflamed seborrheic keratosis: Secondary | ICD-10-CM | POA: Diagnosis not present

## 2021-12-05 DIAGNOSIS — L814 Other melanin hyperpigmentation: Secondary | ICD-10-CM | POA: Diagnosis not present

## 2021-12-05 DIAGNOSIS — D1801 Hemangioma of skin and subcutaneous tissue: Secondary | ICD-10-CM | POA: Diagnosis not present

## 2021-12-05 DIAGNOSIS — L819 Disorder of pigmentation, unspecified: Secondary | ICD-10-CM | POA: Diagnosis not present

## 2021-12-12 ENCOUNTER — Telehealth: Payer: Self-pay | Admitting: Neurology

## 2021-12-12 NOTE — Telephone Encounter (Signed)
Scheduling team reached out to the patient to get her rescheduled for initial cpap due to the provider being out. Pt had questions and was disappointment with [facility]. Pt was transferred to me where I heard her concerns and seems there is poor communication and she is reaching out for help but not getting any help. I have contacted the management team and he is going to have someone reach out to the patient and help her. I also provided her with the [facility] office phone number to call going forward. She was appreciative

## 2021-12-19 DIAGNOSIS — Z20822 Contact with and (suspected) exposure to covid-19: Secondary | ICD-10-CM | POA: Diagnosis not present

## 2022-01-05 ENCOUNTER — Encounter: Payer: Self-pay | Admitting: Neurology

## 2022-01-09 ENCOUNTER — Encounter: Payer: Self-pay | Admitting: Neurology

## 2022-01-09 ENCOUNTER — Ambulatory Visit (INDEPENDENT_AMBULATORY_CARE_PROVIDER_SITE_OTHER): Payer: Medicare Other | Admitting: Neurology

## 2022-01-09 ENCOUNTER — Other Ambulatory Visit: Payer: Self-pay

## 2022-01-09 VITALS — BP 148/96 | HR 82 | Ht 66.0 in | Wt 176.0 lb

## 2022-01-09 DIAGNOSIS — G478 Other sleep disorders: Secondary | ICD-10-CM | POA: Diagnosis not present

## 2022-01-09 DIAGNOSIS — Z9989 Dependence on other enabling machines and devices: Secondary | ICD-10-CM | POA: Diagnosis not present

## 2022-01-09 DIAGNOSIS — J302 Other seasonal allergic rhinitis: Secondary | ICD-10-CM | POA: Diagnosis not present

## 2022-01-09 DIAGNOSIS — J3089 Other allergic rhinitis: Secondary | ICD-10-CM | POA: Diagnosis not present

## 2022-01-09 DIAGNOSIS — G4733 Obstructive sleep apnea (adult) (pediatric): Secondary | ICD-10-CM | POA: Diagnosis not present

## 2022-01-09 DIAGNOSIS — R519 Headache, unspecified: Secondary | ICD-10-CM | POA: Diagnosis not present

## 2022-01-09 MED ORDER — VALERIAN 250 MG PO CAPS: ORAL_CAPSULE | ORAL | 0 refills | Status: DC
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
## 2022-01-16 ENCOUNTER — Telehealth: Payer: Self-pay | Admitting: Neurology

## 2022-01-16 ENCOUNTER — Other Ambulatory Visit (INDEPENDENT_AMBULATORY_CARE_PROVIDER_SITE_OTHER): Payer: Self-pay

## 2022-01-16 ENCOUNTER — Other Ambulatory Visit: Payer: Self-pay | Admitting: *Deleted

## 2022-01-16 ENCOUNTER — Encounter: Payer: Self-pay | Admitting: Neurology

## 2022-01-16 ENCOUNTER — Telehealth (INDEPENDENT_AMBULATORY_CARE_PROVIDER_SITE_OTHER): Payer: Medicare Other | Admitting: Neurology

## 2022-01-16 DIAGNOSIS — G43709 Chronic migraine without aura, not intractable, without status migrainosus: Secondary | ICD-10-CM | POA: Diagnosis not present

## 2022-01-16 DIAGNOSIS — Z0289 Encounter for other administrative examinations: Secondary | ICD-10-CM

## 2022-01-16 MED ORDER — RIZATRIPTAN BENZOATE 10 MG PO TBDP: 10.0000 mg | ORAL_TABLET | ORAL | 11 refills | Status: DC | PRN

## 2022-01-16 MED ORDER — PROPRANOLOL HCL ER 60 MG PO CP24: 60.0000 mg | ORAL_CAPSULE | Freq: Every day | ORAL | 6 refills | Status: DC

## 2022-01-16 NOTE — Progress Notes (Signed)
[facility]    Provider:  Dr Jaynee Eagles Requesting Provider: [name], MD Primary Care Provider:  [name], MD  Virtual Visit via Video Note  I connected with Misty [name] on 01/16/22 at  7:30 AM EST by a video enabled telemedicine application and verified that I am speaking with the correct person using two identifiers.  Location: Patient: home Provider: office   I discussed the limitations of evaluation and management by telemedicine and the availability of in person appointments. The patient expressed understanding and agreed to proceed.  Follow Up Instructions:    I discussed the assessment and treatment plan with the patient. The patient was provided an opportunity to ask questions and all were answered. The patient agreed with the plan and demonstrated an understanding of the instructions.   The patient was advised to call back or seek an in-person evaluation if the symptoms worsen or if the condition fails to improve as anticipated.  I provided over 60 minutes of non-face-to-face time during this encounter.   Misty Beam, MD   CC:  headache  Interval headaches 01/16/2022: She is working with Dr. Brett Fairy on her sleep.She is compliant with the cpap. Headaches and migranies are worse nw and more severe. She is having 12 migraines a month and at least 15 headaches days, we spoke about lifestyle, journaling, her deviated septum, her MRI results, her sleep apnea, we spoke about prevention options, likely migraines.  Answered all questions.  MRI brain (with and without) demonstrating: - Mild periventricular and subcortical juxtacortical and pontine foci of nonspecific T2 hyperintensities.  No abnormal lesions are seen on post contrast views.  Considerations include autoimmune, inflammatory, post-infectious, microvascular ischemic or migraine associated etiologies.  - No acute findings.  MRI cervical spine: Unremarkable  Patient complains  of symptoms per HPI as well as the following symptoms: deviated septum . Pertinent negatives and positives per HPI. All others negative   HPI:  Misty [name] is a 71 y.o. female here as requested by Misty Hatchet, MD for headaches. Started a year ago, she used to have migraines when she was younger. Last summer she was on effexor and she had tapered it in July of last year she started not being able to sleep she saw her doctor in November, she started taking xanax at bedtime with trazodone, this is in 2021. In January in 2022 she started feeling a little blue, she tried Lexapro and she woke up with a blinding headache that lasted for days. She tried Zoloft after that and she stopped xanax. She then changed to Buspar May 2022 and the headaches still persisted but not as bad as below, always similar to prior migraines, a little sound and light sensitivity, throbbing always in the forehead, no nausea. She has headaches 3x a week and can wake with them. She does not feel refreshed, wakes with headaches. She fell 4 years ago on her nose. Se has had some vision changes and she has gone through a year of changing glasses it was strange, decreased vision in the right eye.   2-3x a week, at least 1x a week she wakes up with them, still waking frequently at night, she sleeps in the guest bedroom, toss and turn all night, usually 1/2 excedrin migraine will stop it, some light sound and sound sensitivity, she has cut off caffeine, no increased alcohol. No known triggers. She doesn't feel rested in the morning and they can also start late morning as well, mild to  moderate in pain. No other focal neurologic deficits, associated symptoms, inciting events or modifiable factors.  Reviewed notes, labs and imaging from outside physicians, which showed:  All CT scans at [facility] are performed using dose optimization techniques as appropriate to a performed exam,  including but not limited to one or more of the following: automated exposure control, adjustment of the mA and/or kV according to patient size, use of iterative reconstruction technique. In addition, [facility] is participating in the [place] program which will further assist [place] in optimizing patient radiation exposure.    CT head 08/08/2017:   .  Calvarium/skull base: No evidence of acute fracture or destructive lesion. No substantial disease in the mastoids or middle ears.  .  Paranasal sinuses: Small amount of fluid right maxillary sinus.  .  Brain: No evidence of acute large vascular territory infarct. No mass effect, mass lesion, or hydrocephalus. No acute hemorrhage. Bilateral nasal bone fractures. Slight displacement to the left  Review of Systems: Patient complains of symptoms per HPI as well as the following symptoms morning headache. Pertinent negatives and positives per HPI. All others negative.   Social History   Socioeconomic History   Marital status: Married    Spouse name: Not on file   Number of children: 0   Years of education: Not on file   Highest education level: Not on file  Occupational History   Occupation: non Airline pilot  Tobacco Use   Smoking status: Never   Smokeless tobacco: Never  Vaping Use   Vaping Use: Never used  Substance and Sexual Activity   Alcohol use: Yes    Comment: occ   Drug use: No   Sexual activity: Yes    Partners: Male  Other Topics Concern   Not on file  Social History Narrative   Not on file   Social Determinants of Health   Financial Resource Strain: Not on file  Food Insecurity: Not on file  Transportation Needs: Not on file  Physical Activity: Not on file  Stress: Not on file  Social Connections: Not on file  Intimate Partner Violence: Not on file    Family History  Problem Relation Age of Onset   Dementia Mother    Hypertension Mother    Glaucoma Mother    Heart disease Father    Rheum arthritis  Father    Lupus Father    Aneurysm Father    CAD Father    Hypertension Father    Hyperlipidemia Father    Stroke Maternal Grandfather    Breast cancer Neg Hx    Colon cancer Neg Hx    Stomach cancer Neg Hx    Esophageal cancer Neg Hx    Rectal cancer Neg Hx    Migraines Neg Hx     Past Medical History:  Diagnosis Date   Allergy    Anxiety    Depression    GERD (gastroesophageal reflux disease)    GERD (gastroesophageal reflux disease)    Headache    Hiatal hernia    Hypercholesteremia    Insomnia    Osteopenia    Plantar fascia syndrome    Scoliosis    chronic pain    Patient Active Problem List   Diagnosis Date Noted   Chronic migraine without aura without status migrainosus, not intractable 01/16/2022   Witnessed episode of apnea 09/05/2021   Worsening headaches 09/05/2021   Morning headache 09/05/2021   Behavioral  insomnia of childhood 09/05/2021   Non-restorative sleep 09/05/2021   Plantar fasciitis of left foot 03/24/2018   Asthmatic bronchitis without complication 73/53/2992   Seasonal and perennial allergic rhinitis 11/13/2014    Past Surgical History:  Procedure Laterality Date   COLONOSCOPY     excision of morton's neuroma     [place]    Current Outpatient Medications  Medication Sig Dispense Refill   propranolol ER (INDERAL LA) 60 MG 24 hr capsule Take 1 capsule (60 mg total) by mouth daily. 30 capsule 6   rizatriptan (MAXALT-MLT) 10 MG disintegrating tablet Take 1 tablet (10 mg total) by mouth as needed for migraine. May repeat in 2 hours if needed 9 tablet 11   Cholecalciferol (VITAMIN D) 125 MCG (5000 UT) CAPS Take 5,000 Units by mouth every other day.     esomeprazole (NEXIUM) 40 MG capsule Take 1 capsule by mouth daily.     ezetimibe (ZETIA) 10 MG tablet Take 10 mg by mouth daily.     Magnesium Gluconate 550 MG TABS Take by mouth.     Multiple Vitamins-Minerals (MULTIVITAMIN PO)  Take 1 tablet by mouth daily.     traZODone (DESYREL) 50 MG tablet Take 50 mg by mouth at bedtime as needed.     Valerian 250 MG CAPS 1 caps before bedtime po. 336 capsule 0   vitamin B-12 (CYANOCOBALAMIN) 500 MCG tablet Take by mouth.     Current Facility-Administered Medications  Medication Dose Route Frequency Provider Last Rate Last Admin   0.9 %  sodium chloride infusion  500 mL Intravenous Once [place], MD        Allergies as of 01/16/2022 - Review Complete 01/09/2022  Allergen Reaction Noted   Dust mite extract  08/14/2021   Latex Hives 08/11/2017   Macrobid  [nitrofurantoin macrocrystal]  06/09/2019   Molds & smuts  08/14/2021    Vitals: LMP  (LMP Unknown)  Last Weight:  Wt Readings from Last 1 Encounters:  01/09/22 176 lb (79.8 kg)   Last Height:   Ht Readings from Last 1 Encounters:  01/09/22 $RemoveB'5\' 6"'HNEQNQLU$  (1.676 m)    Physical exam: Exam: Gen: NAD, conversant      CV: Could not perform over Web Video. Denies palpitations or chest pain or SOB. VS: Breathing at a normal rate. Weight appears within normal limits. Not febrile. Eyes: Conjunctivae clear without exudates or hemorrhage  Neuro: Detailed Neurologic Exam  Speech:    Speech is normal; fluent and spontaneous with normal comprehension.  Cognition:    The patient is oriented to person, place, and time;     recent and remote memory intact;     language fluent;     normal attention, concentration,     fund of knowledge Cranial Nerves:    The pupils are equal, round, and reactive to light. Attempted, Cannot perform fundoscopic exam. Visual fields are full to finger confrontation. Extraocular movements are intact.  The face is symmetric with normal sensation. The palate elevates in the midline. Hearing intact. Voice is normal. Shoulder shrug is normal. The tongue has normal motion without fasciculations.    Motor Observation:   no involuntary movements noted. Tone:    Appears normal  Posture:     Posture is normal. normal erect    Strength:    Strength is anti-gravity and symmetric in the upper and lower limbs.      Sensation: intact  to LT       Assessment/Plan:  Patient with likely migraines   She is working with Dr. Brett Fairy on her sleep.She is compliant with the cpap. Headaches and migranies are worse nw and more severe. She is having 12 migraines a month and at least 15 headaches days, we spoke about lifestyle, journaling, her deviated septum, her MRI results, her sleep apnea, we spoke about prevention options, likely migraines.  Answered all questions.  Try propranolol as prevention. We can increase it. Could also try Topiramate a very classic medication for migraines. Acute: rizatriptan: Please take one tablet at the onset of your headache. If it does not improve the symptoms please take one additional tablet. Do not take more then 2 tablets in 24hrs. Do not take use more then 2 to 3 times in a week.  Newer meds: Leland Her, Nurtec, Lajean Silvius  Stopping by the office for esr/crp  Letting nurse know patient stopping for bloodwork  Reviewed chart  Discussed: To prevent or relieve headaches, try the following: Cool Compress. Lie down and place a cool compress on your head.  Avoid headache triggers. If certain foods or odors seem to have triggered your migraines in the past, avoid them. A headache diary might help you identify triggers.  Include physical activity in your daily routine. Try a daily walk or other moderate aerobic exercise.  Manage stress. Find healthy ways to cope with the stressors, such as delegating tasks on your to-do list.  Practice relaxation techniques. Try deep breathing, yoga, massage and visualization.  Eat regularly. Eating regularly scheduled meals and maintaining a healthy diet might help prevent headaches. Also, drink plenty of fluids.  Follow a regular sleep schedule. Sleep deprivation might contribute to headaches Consider  biofeedback. With this mind-body technique, you learn to control certain bodily functions -- such as muscle tension, heart rate and blood pressure -- to prevent headaches or reduce headache pain.    Proceed to emergency room if you experience new or worsening symptoms or symptoms do not resolve, if you have new neurologic symptoms or if headache is severe, or for any concerning symptom.   Provided education and documentation from American headache [employer] toolbox including articles on: chronic migraine medication overuse headache, chronic migraines, prevention of migraines, behavioral and other nonpharmacologic treatments for headache.   Orders Placed This Encounter  Procedures   Sedimentation rate   C-reactive protein   Meds ordered this encounter  Medications   propranolol ER (INDERAL LA) 60 MG 24 hr capsule    Sig: Take 1 capsule (60 mg total) by mouth daily.    Dispense:  30 capsule    Refill:  6   rizatriptan (MAXALT-MLT) 10 MG disintegrating tablet    Sig: Take 1 tablet (10 mg total) by mouth as needed for migraine. May repeat in 2 hours if needed    Dispense:  9 tablet    Refill:  11     Cc: Misty Hatchet, MD,  Misty Hatchet, MD  Sarina Ill, MD  [facility] [address] [place], [place] [address]  Phone [phone] Fax [phone]

## 2022-01-16 NOTE — Telephone Encounter (Signed)
I called and left voice mail for patient to give [place] a call back to schedule or told her she can schedule this appointment at check out when she comes in this afternoon for her lab work. 8 weeks from today is March 15.

## 2022-01-16 NOTE — Telephone Encounter (Signed)
Please call Misty [name] an appointment ith dr Jaynee Eagles in 8 weeks

## 2022-01-16 NOTE — Patient Instructions (Addendum)
Try propranolol as prevention. We can increase it. Could also try Topiramate a very classic medication for migraines. Acute: rizatriptan: Please take one tablet at the onset of your headache. If it does not improve the symptoms please take one additional tablet. Do not take more then 2 tablets in 24hrs. Do not take use more then 2 to 3 times in a week.  Newer meds: Leland Her, Nurtec, Lajean Silvius  Temporal Arteritis Temporal arteritis is a condition that causes arteries to become inflamed. It usually affects arteries in your head and face, but arteries in any part of the body can become inflamed. The condition is also called giant cell arteritis.  Temporal arteritis can cause serious problems such as blindness. Early treatment can help prevent these problems. What are the causes? The cause of this condition is not known. What increases the risk? The following factors may make you more likely to develop this condition: Being older than 50. Being a woman. Being Caucasian. Being of [place], [place], [place], [place] or [place] ancestry. Having a family history of the condition. Having a certain condition that causes muscle pain and stiffness (polymyalgia rheumatica, PMR). What are the signs or symptoms? Some people with temporal arteritis have just one symptom, while others have several symptoms. Most symptoms are related to the head and face. These may include: Headache. Hard, swollen, or tender temples. This is common. Your temples are the areas on either side of your forehead. Pain when combing your hair or when laying your head down. Pain in the jaw when chewing. Pain in the throat or tongue. Problems with your vision, such as sudden loss of vision in one eye, or seeing double. Other symptoms may include: Fever. Tiredness (fatigue). A dry cough. Pain in the hips and shoulders. Pain in the arms during exercise. Depression. Weight loss. How is this diagnosed? This  condition may be diagnosed based on: Your symptoms. Your medical history. A physical exam. Tests, including: Blood tests. A test in which a tissue sample is removed from an artery so it can be examined (biopsy). Imaging tests, such as an ultrasound or MRI. How is this treated? This condition may be treated with: A type of medicine to reduce inflammation (corticosteroid). Medicines to weaken your immune system (immunosuppressants). Other medicines to treat vision problems. You will need to see your health care provider while you are being treated. The medicines used to treat this condition can increase your risk of problems such as bone loss and diabetes. During follow-up visits, your health care provider will check for problems by: Doing blood tests and bone density tests. Checking your blood pressure and blood sugar. Follow these instructions at home: Medicines Take over-the-counter and prescription medicines only as told by your health care provider. Take any vitamins or supplements recommended by your health care provider. These may include vitamin D and calcium, which help keep your bones from becoming weak. Eating and drinking  Eat a heart-healthy diet. This may include: Eating high-fiber foods, such as fresh fruits and vegetables, whole grains, and beans. Eating heart-healthy fats (omega-3 fats), such as fish, flaxseed, and flaxseed oil. Limiting foods that are high in saturated fat and cholesterol, such as processed and fried foods, fatty meat, and full-fat dairy. Limiting how much salt (sodium) you eat. Include calcium and vitamin D in your diet. Good sources of calcium and vitamin D include: Low-fat dairy products such as milk, yogurt, and cheese. Certain fish, such as fresh or canned salmon, tuna, and sardines. Products that  have calcium and vitamin D added to them (fortified products), such as fortified cereals or juice. General instructions Exercise. Talk with your health  care provider about what exercises are okay for you. Exercises that increase your heart rate (aerobic exercise), such as walking, are often recommended. Aerobic exercise helps control your blood pressure and prevent bone loss. Stay up to date on all vaccines as directed by your health care provider. Keep all follow-up visits as told by your health care provider. This is important. Contact a health care provider if: Your symptoms get worse. You develop signs of infection, such as fever, swelling, redness, warmth, and tenderness. Get help right away if: You lose your vision. Your pain does not go away, even after you take medicine. You have chest pain. You have trouble breathing. One side of your face or body suddenly becomes weak or numb. These symptoms may represent a serious problem that is an emergency. Do not wait to see if the symptoms will go away. Get medical help right away. Call your local emergency services (911 in the [place] Do not drive yourself to the hospital. Summary Temporal arteritis is a condition that causes arteries to become inflamed. It usually affects arteries in your head and face. This condition can cause serious problems, such as blindness. Treatment can help prevent these problems. Symptoms may include hard or tender temples, pain in your jaw when chewing, problems with your vision, or pain in your hips and shoulders. Take over-the-counter and prescription medicines as told by your health care provider. This information is not intended to replace advice given to you by your health care provider. Make sure you discuss any questions you have with your health care provider. Document Revised: 02/27/2021 Document Reviewed: 02/27/2021 Elsevier Patient Education  [employer].   Propranolol Extended-Release Capsules What is this medication? PROPRANOLOL (proe PRAN oh lole) treats many conditions such as high blood pressure and heart disease. It may also be used to  prevent chest pain (angina). It works by lowering your blood pressure and heart rate, making it easier for your heart to pump blood to the rest of your body. It can also be used to prevent migraine headaches. It works by relaxing the blood vessels in the brain that cause migraines. It belongs to a group of medications called beta blockers. This medicine may be used for other purposes; ask your health care provider or pharmacist if you have questions. COMMON BRAND NAME(S): Inderal LA, Inderal XL, InnoPran XL What should I tell my care team before I take this medication? They need to know if you have any of these conditions: Circulation problems, or blood vessel disease Diabetes History of heart attack or heart disease, vasospastic angina Kidney disease Liver disease Lung or breathing disease, like asthma or emphysema Pheochromocytoma Slow heart rate Thyroid disease An unusual or allergic reaction to propranolol, other beta-blockers, medications, foods, dyes, or preservatives Pregnant or trying to get pregnant Breast-feeding How should I use this medication? Take this medication by mouth. Take it as directed on the prescription label at the same time every day. Do not cut, crush or chew this medication. Swallow the capsules whole. You can take it with or without food. If it upsets your stomach, take it with food. Keep taking it unless your care team tells you to stop. Talk to your care team about the use of this medication in children. Special care may be needed. Overdosage: If you think you have taken too much of this medicine  contact a poison control center or emergency room at once. NOTE: This medicine is only for you. Do not share this medicine with others. What if I miss a dose? If you miss a dose, take it as soon as you can. If it is almost time for your next dose, take only that dose. Do not take double or extra doses. What may interact with this medication? Do not take this medication  with any of the following: Feverfew Phenothiazines like chlorpromazine, mesoridazine, prochlorperazine, thioridazine This medication may also interact with the following: Aluminum hydroxide gel Antipyrine Antiviral medications for HIV or AIDS Barbiturates like phenobarbital Certain medications for blood pressure, heart disease, irregular heart beat Cimetidine Ciprofloxacin Diazepam Fluconazole Haloperidol Isoniazid Medications for cholesterol like cholestyramine or colestipol Medications for mental depression Medications for migraine headache like almotriptan, eletriptan, frovatriptan, naratriptan, rizatriptan, sumatriptan, zolmitriptan NSAIDs, medications for pain and inflammation, like ibuprofen or naproxen Phenytoin Rifampin Teniposide Theophylline Thyroid medications Tolbutamide Warfarin Zileuton This list may not describe all possible interactions. Give your health care provider a list of all the medicines, herbs, non-prescription drugs, or dietary supplements you use. Also tell them if you smoke, drink alcohol, or use illegal drugs. Some items may interact with your medicine. What should I watch for while using this medication? Visit your care team for regular checks on your progress. Check your blood pressure as directed. Ask your care team what your blood pressure should be. Also, find out when you should contact him or her. Do not treat yourself for coughs, colds, or pain while you are using this medication without asking your care team for advice. Some medications may increase your blood pressure. You may get drowsy or dizzy. Do not drive, use machinery, or do anything that needs mental alertness until you know how this medication affects you. Do not stand up or sit up quickly, especially if you are an older patient. This reduces the risk of dizzy or fainting spells. Alcohol may interfere with the effect of this medication. Avoid alcoholic drinks. This medication may  increase blood sugar. Ask your care team if changes in diet or medications are needed if you have diabetes. Do not suddenly stop taking this medication. You may develop severe heart-related effects. Your care team will tell you how much medication to take. If your care team wants you to stop the medication, the dose may be slowly lowered over time to avoid any side effects. What side effects may I notice from receiving this medication? Side effects that you should report to your care team as soon as possible: Allergic reactions--skin rash, itching, hives, swelling of the face, lips, tongue, or throat Heart failure--shortness of breath, swelling of the ankles, feet, or hands, sudden weight gain, unusual weakness or fatigue Low blood pressure--dizziness, feeling faint or lightheaded, blurry vision Raynaud's--cool, numb, or painful fingers or toes that may change color from pale, to blue, to red Redness, blistering, peeling, or loosening of the skin, including inside the mouth Slow heartbeat--dizziness, feeling faint or lightheaded, confusion, trouble breathing, unusual weakness or fatigue Worsening mood, feelings of depression Side effects that usually do not require medical attention (report to your care team if they continue or are bothersome): Change in sex drive or performance Diarrhea Dizziness Fatigue Headache This list may not describe all possible side effects. Call your doctor for medical advice about side effects. You may report side effects to FDA at 1-800-FDA-1088. Where should I keep my medication? Keep out of the reach of children and pets.  Store at room temperature between 15 and 30 degrees C (59 and 86 degrees F). Protect from light and moisture. Keep the container tightly closed. Avoid exposure to extreme heat. Do not freeze. Throw away any unused medication after the expiration date. NOTE: This sheet is a summary. It may not cover all possible information. If you have questions  about this medicine, talk to your doctor, pharmacist, or health care provider.  2022 Elsevier/Gold Standard (2021-09-04 00:00:00)

## 2022-01-17 LAB — C-REACTIVE PROTEIN: CRP: <1 mg/L (ref 0–10)

## 2022-01-17 LAB — SEDIMENTATION RATE: Sed Rate: 4 mm/hr (ref 0–40)

## 2022-01-27 ENCOUNTER — Encounter: Payer: Self-pay | Admitting: Neurology

## 2022-01-28 ENCOUNTER — Other Ambulatory Visit: Payer: Self-pay

## 2022-01-28 ENCOUNTER — Ambulatory Visit (INDEPENDENT_AMBULATORY_CARE_PROVIDER_SITE_OTHER): Payer: Medicare Other | Admitting: Neurology

## 2022-01-28 DIAGNOSIS — G4733 Obstructive sleep apnea (adult) (pediatric): Secondary | ICD-10-CM | POA: Diagnosis not present

## 2022-01-28 DIAGNOSIS — G478 Other sleep disorders: Secondary | ICD-10-CM

## 2022-01-28 DIAGNOSIS — R519 Headache, unspecified: Secondary | ICD-10-CM

## 2022-01-28 DIAGNOSIS — J302 Other seasonal allergic rhinitis: Secondary | ICD-10-CM

## 2022-01-28 DIAGNOSIS — J3089 Other allergic rhinitis: Secondary | ICD-10-CM

## 2022-01-29 ENCOUNTER — Ambulatory Visit: Payer: Medicare Other | Admitting: Neurology

## 2022-02-06 DIAGNOSIS — J343 Hypertrophy of nasal turbinates: Secondary | ICD-10-CM | POA: Diagnosis not present

## 2022-02-06 DIAGNOSIS — J342 Deviated nasal septum: Secondary | ICD-10-CM | POA: Diagnosis not present

## 2022-02-06 DIAGNOSIS — J310 Chronic rhinitis: Secondary | ICD-10-CM | POA: Diagnosis not present

## 2022-02-11 ENCOUNTER — Encounter: Payer: Self-pay | Admitting: Neurology

## 2022-02-13 ENCOUNTER — Encounter: Payer: Self-pay | Admitting: Neurology

## 2022-02-13 ENCOUNTER — Telehealth (INDEPENDENT_AMBULATORY_CARE_PROVIDER_SITE_OTHER): Payer: Medicare Other | Admitting: Neurology

## 2022-02-13 ENCOUNTER — Ambulatory Visit: Payer: Medicare Other | Admitting: Neurology

## 2022-02-13 DIAGNOSIS — G43709 Chronic migraine without aura, not intractable, without status migrainosus: Secondary | ICD-10-CM

## 2022-02-13 MED ORDER — TOPIRAMATE 25 MG PO TABS: ORAL_TABLET | ORAL | 3 refills | Status: DC

## 2022-02-13 MED ORDER — SUMATRIPTAN SUCCINATE 50 MG PO TABS: 50.0000 mg | ORAL_TABLET | ORAL | 3 refills | Status: DC | PRN

## 2022-02-13 NOTE — Patient Instructions (Addendum)
Continue propranolol for headache prevention  Start topamax for headache prevention  Stop Maxalt, try Imitrex for acute headache treatment  Meds ordered this encounter  Medications   SUMAtriptan (IMITREX) 50 MG tablet    Sig: Take 1 tablet (50 mg total) by mouth every 2 (two) hours as needed for migraine. May repeat in 2 hours if headache persists or recurs.    Dispense:  10 tablet    Refill:  3   topiramate (TOPAMAX) 25 MG tablet    Sig: Take 1 tablet at bedtime x 3 days, then take 2 tablets at bedtime x 3 days, then take 3 at bedtime    Dispense:  120 tablet    Refill:  3

## 2022-02-13 NOTE — Progress Notes (Signed)
Virtual Visit via Video Note  I connected with Misty [name] on 02/13/22 at  3:45 PM EST by a video enabled telemedicine application and verified that I am speaking with the correct person using two identifiers.  Location: Patient: at her home Provider: in the office    I discussed the limitations of evaluation and management by telemedicine and the availability of in person appointments. The patient expressed understanding and agreed to proceed.  History of Present Illness: Update 02/13/2022 SS:  Headaches are better, not great. Started propranolol for prevention 3 weeks ago, taken Maxalt 3 times, claims it knocks her out to where can't function. Headaches are unpredictable, lately starting late afternoon. The Maxalt does help the headache. Headaches are not daily, but most days has slight headache, in last 3 weeks only 3 bad headaches. HR has been 59. Is compliant with CPAP, no change in headaches, had in lab sleep study recently. Going to [place] March 1st. Having deviated septum surgery end of March.   Interval headaches 01/16/2022: She is working with Dr. Brett Fairy on her sleep.She is compliant with the cpap. Headaches and migranies are worse nw and more severe. She is having 12 migraines a month and at least 15 headaches days, we spoke about lifestyle, journaling, her deviated septum, her MRI results, her sleep apnea, we spoke about prevention options, likely migraines.  Answered all questions.   MRI brain (with and without) demonstrating: - Mild periventricular and subcortical juxtacortical and pontine foci of nonspecific T2 hyperintensities.  No abnormal lesions are seen on post contrast views.  Considerations include autoimmune, inflammatory, post-infectious, microvascular ischemic or migraine associated etiologies.  - No acute findings.   MRI cervical spine: Unremarkable   Patient complains of symptoms per HPI as well as the following symptoms: deviated septum . Pertinent negatives  and positives per HPI. All others negative     HPI:  Misty [name] is a 71 y.o. female here as requested by [name], MD for headaches. Started a year ago, she used to have migraines when she was younger. Last summer she was on effexor and she had tapered it in July of last year she started not being able to sleep she saw her doctor in November, she started taking xanax at bedtime with trazodone, this is in 2021. In January in 2022 she started feeling a little blue, she tried Lexapro and she woke up with a blinding headache that lasted for days. She tried Zoloft after that and she stopped xanax. She then changed to Buspar May 2022 and the headaches still persisted but not as bad as below, always similar to prior migraines, a little sound and light sensitivity, throbbing always in the forehead, no nausea. She has headaches 3x a week and can wake with them. She does not feel refreshed, wakes with headaches. She fell 4 years ago on her nose. Se has had some vision changes and she has gone through a year of changing glasses it was strange, decreased vision in the right eye.    2-3x a week, at least 1x a week she wakes up with them, still waking frequently at night, she sleeps in the guest bedroom, toss and turn all night, usually 1/2 excedrin migraine will stop it, some light sound and sound sensitivity, she has cut off caffeine, no increased alcohol. No known triggers. She doesn't feel rested in the morning and they can also start late morning as well, mild to moderate in pain. No other focal neurologic deficits,  associated symptoms, inciting events or modifiable factors.   Observations/Objective: Via virtual visit, alert and oriented, speech is clear and concise, noted to move all extremities  Assessment and Plan: 1.  Chronic headaches, likely migraines  -Good benefit with propanolol, but limited on ability to increase due to reported heart rate 59 -Add on Topamax working up to 75 mg at  bedtime for headache prevention -For now, we will continue propanolol, but may consider weaning off -Maxalt is helpful, but reports side effects, will try Imitrex 50 mg as needed for acute headache treatment -Has had in lab sleep titration study, is awaiting results, but is compliant with CPAP reportedly -Planning for septoplasty end of March, which thought to be contributing to headaches  Follow Up Instructions: Has appointment scheduled with Dr. Jaynee Eagles March 13, 2022   I discussed the assessment and treatment plan with the patient. The patient was provided an opportunity to ask questions and all were answered. The patient agreed with the plan and demonstrated an understanding of the instructions.   The patient was advised to call back or seek an in-person evaluation if the symptoms worsen or if the condition fails to improve as anticipated.  Evangeline Dakin, DNP  [facility] [address] [place], [place] [address] [phone]

## 2022-02-16 ENCOUNTER — Encounter: Payer: Self-pay | Admitting: Neurology

## 2022-02-18 ENCOUNTER — Telehealth: Payer: Self-pay

## 2022-02-18 ENCOUNTER — Encounter: Payer: Self-pay | Admitting: *Deleted

## 2022-02-18 DIAGNOSIS — G4733 Obstructive sleep apnea (adult) (pediatric): Secondary | ICD-10-CM | POA: Insufficient documentation

## 2022-02-18 NOTE — Telephone Encounter (Signed)
I called patient. This was an extended call > 17 minutes.  I discussed her sleep study results and recommendations. She will continue CPAP. She will work with [facility] with her mask. She will discuss psychologist/CBT with her PCP.  She has questions regarding the valerian root dose and I have sent this question again via mychart to Dr. Brett Fairy.  A follow up was made with Dr. Brett Fairy on 06/19/22 at 10:30am for her sleep and cpap follow up.  Pt verbalized understanding of results.

## 2022-02-18 NOTE — Telephone Encounter (Signed)
-----   Message from Larey Seat, MD sent at 02/18/2022  1:05 PM EST ----- CPAP was initiated at 6  cmH20 with 2 cm EPR on heated humidity.  At a PAP pressure of 6 cmH20, there was a reduction of the AHI to 0.0 with improvement of sleep apnea. REM sleep occurred at 4.15 AM.  DIAGNOSIS 1.  Obstructive Sleep Apnea was sufficiently treated under 6 cm water pressure- sleep interruptions were unrelated to physiological factors.  2.  PLANS/RECOMMENDATIONS: 1. The patient will continue CPAP therapy, therapy compliance is defined as 4 hours or more of nightly use. The mask will continue to be a ResMed N 20 medium sized nasal mask.    DISCUSSION: Insomnia is unrelated to organic /physiological factors and should be treated by psychologist/ cognitive behavior specialist.

## 2022-02-18 NOTE — Addendum Note (Signed)
Addended by: Larey Seat on: 02/18/2022 01:05 PM   Modules accepted: Orders

## 2022-02-18 NOTE — Procedures (Signed)
PATIENT'S NAME:  Misty [name], Misty [name] DOB:      01/28/1951      MR#:    [id number]     DATE OF RECORDING: 01/28/2022 Artis Flock REFERRING M.D.:  [name], MD Study Performed:   CPAP  Titration HISTORY:  Misty [name] is a 71 y.o.  Caucasian female patient seen here in a RV on 01/09/2022 from  Dr Jaynee Eagles and her PCP is Dr. Ardeth Perfect.  Chief concern according to patient : headaches and chronic Insomnia follow up after 09-05-2021 visit and sleep study.  The patient's main concern was longstanding insomnia and less responsiveness to established sleep aids.   Seen on 09 January 2022 - a home sleep test - was performed on 9-26/2022.  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  There was no hypoxia or heart rate abnormality.  Supine sleep was also increased associated with an increase in apnea and hypopnea indices and snoring.  Snoring was present for much of the night.  Sleep was most fragmented after the morning hours in her case during this home sleep test after 5:30 AM.  I recommended to treat with CPAP because REM dependent sleep apnea would not correspond well with a dental device or an inspire implant device.  Heated humidification and an auto titration CPAP was ordered between 6 and 16 cmH2O pressure was 2 cm expiratory pressure relief.  The Epworth sleepiness score had only been endorsed at 4 points but fatigue severity at 40 out of 63 points much higher degree than sleepiness.  I also have access to her auto titration CPAP data today 11-19-2021.  So this was a recent set up.  And the ramp pressure begins at 4 cmH2O.  She has used the machine over the last 30 days 30 times which is a compliance of 100%.  Total user time is average 6 hours and 6 minutes, with 82% compliance.  The average pressure at night required or the 95th percentile of pressure is 8.1 cm water.  She does not have significant air leaks and her residual AHI is 0.3/h so  this is a significant improvement. ENT appointment pending.   Calculated pAHI (per hour): The apnea hypopnea index for the total study was 15.8/h and much higher in REM sleep than non-REM sleep.  During REM sleep the AHI amounted to 42.4/h of sleep and the non-REM sleep AHI was only 7.4/h of sleep.                                             Supine AHI: Positional data indicate supine AHI to be 46.1/h versus a non-REM supine sleep AHI at 10.8/h.   The patient had a slightly lower AHI in the right lateral sleep compared to sleep in the left sided sleep position.   The patient endorsed the Epworth Sleepiness Scale at 5 points.   The patient's weight 176 pounds with a height of 66 (inches), resulting in a BMI of 28.3 kg/m2. The patient's neck circumference measured 15 inches.  CURRENT MEDICATIONS: Vitamin D, Nexium, Zetia, Magnesium Gluconate, Multivitamin, Desyrel, Vitamin B-12, Sodium Chloride infusion  PROCEDURE:  This is a multichannel digital polysomnogram utilizing the SomnoStar 11.2 system.  Electrodes and sensors were applied and monitored per AASM Specifications.   EEG, EOG, Chin and Limb EMG, were sampled at  200 Hz.  ECG, Snore and Nasal Pressure, Thermal Airflow, Respiratory Effort, CPAP Flow and Pressure, Oximetry was sampled at 50 Hz. Digital video and audio were recorded.       CPAP was initiated at 6  cmH20 with 2 cm EPR on heated humidity.  At a PAP pressure of 6 cmH20, there was a reduction of the AHI to 0.0 with improvement of sleep apnea. REM sleep occurred at 4.15 AM.   Lights Out was at 22:50 and Lights On at 05:34. Total recording time (TRT) was 404.5 minutes, with a total sleep time (TST) of 256 minutes. The patient's sleep latency was 67 minutes. REM latency was 301.5 minutes.  The sleep efficiency was 63.3 %.    SLEEP ARCHITECTURE: WASO (Wake after sleep onset)  was 119.5 minutes.  There were 14.5 minutes in Stage N1, 167.5 minutes Stage N2, 41.5 minutes Stage N3 and 32.5  minutes in Stage REM.  The percentage of Stage N1 was 5.7%, Stage N2 was 65.4%, Stage N3 was 16.2% and Stage R (REM sleep) was 12.7%.   RESPIRATORY ANALYSIS:  There was a total of 0 respiratory events: 0 obstructive apneas, 0 central apneas and 0 mixed apneas with a total of 0 apneas and an apnea index (AI) of 0 /hour. There were 0 hypopneas with a hypopnea index of 0/hour. The patient also had 0 respiratory event related arousals (RERAs).     The total APNEA/HYPOPNEA INDEX  (AHI) was 0 /hour and the total RESPIRATORY DISTURBANCE INDEX was 0 /hour=  0 events occurred in REM sleep and 0 events in NREM. The REM AHI was 0 /hour versus a non-REM AHI of 0 /hour.  The patient spent 256 minutes in non-supine. The supine AHI was 0.0, versus a non-supine AHI of 0.0.  OXYGEN SATURATION & C02:  The baseline 02 saturation was 96%, with the lowest being 92%.   PERIODIC LIMB MOVEMENTS:  The patient had a total of 0 Periodic Limb Movements.  The arousals were noted as: 23 were spontaneous, 0 were associated with PLMs, 0 were associated with respiratory events.  Audio and video analysis did not show any abnormal or unusual movements, behaviors, phonations or vocalizations.   EKG was in keeping with normal sinus rhythm (NSR).  DIAGNOSIS  Obstructive Sleep Apnea was sufficiently treated under 6 cm water pressure- sleep interruptions were unrelated to physiological factors.   PLANS/RECOMMENDATIONS: The patient will continue CPAP therapy, therapy compliance is defined as 4 hours or more of nightly use. The mask will continue to be a ResMed N 20 medium sized nasal mask.    DISCUSSION: Insomnia is unrelated to organic /physiological factors and should be treated by psychologist/ cognitive behavior specialist.   A follow up appointment will be scheduled in the Sleep Clinic at [facility].   Please call [phone] with any questions.     I certify that I have reviewed the entire raw data  recording prior to the issuance of this report in accordance with the Standards of Accreditation of the American Academy of Sleep Medicine (AASM)  Larey Seat, M.D. Diplomat, Tax adviser of Neurology, [place], Tax adviser of Sleep Medicine

## 2022-02-18 NOTE — Progress Notes (Signed)
CPAP was initiated at 6  cmH20 with 2 cm EPR on heated humidity.  At a PAP pressure of 6 cmH20, there was a reduction of the AHI to 0.0 with improvement of sleep apnea. REM sleep occurred at 4.15 AM.  DIAGNOSIS 1.  Obstructive Sleep Apnea was sufficiently treated under 6 cm water pressure- sleep interruptions were unrelated to physiological factors.  2.  PLANS/RECOMMENDATIONS: 1. The patient will continue CPAP therapy, therapy compliance is defined as 4 hours or more of nightly use. The mask will continue to be a ResMed N 20 medium sized nasal mask.    DISCUSSION: Insomnia is unrelated to organic /physiological factors and should be treated by psychologist/ cognitive behavior specialist.

## 2022-02-19 ENCOUNTER — Other Ambulatory Visit: Payer: Self-pay | Admitting: Otolaryngology

## 2022-02-20 NOTE — Progress Notes (Addendum)
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 Former patient last seen 2016 w hx  Insomnia, Allergic Rhinitis, Asthmatic Bronchitis. Self-referred back. Medical problem list includes Migraine, GERD,  Headache,OSA,  HST 09/27/21 (Dohmeier)  AHI 15.8/ hr, REM AHI 42.4/ hr, Desaturation to 84%, 176 lbs      Started CPAP auto 6-16, EPR 2   [facility] [employer] new Nov  2022 CPAP titration 01/28/22- 6 cwp AHI 0/ hr. Download ([employer]) compliance 92.2%, AHI 0.4/ hr Meds include Trazodone 50, Inderal,  Epworth score-4 Body weight today-178 lbs Covid vax-4 Phizer Flu vax-had -----Patient was last seen in 2016. Currently has a Oceanographer. Was set up with it in November 2022. Did HST and in lab sleep study. States that she has really bad headaches and does not sleep good at night. She has not noticed a difference since getting CPAP. Remote CBT therapist. She was referred to Dr. Ahearn/neurology because of headache.  Trazodone had worked well for insomnia up until about a year and a half ago.  She usually has no trouble falling asleep but then frequent waking after sleep onset.  She was tried on Effexor but stopped because of head jerks which resolved off Effexor.  Weaned herself off over several months.  Later-October-insomnia got worse.  Her PCP tried Xanax which did not help but at that time headaches got worse.  Felt depressed off Effexor.  Tried Lexapro which made headache even worse.  Has tried Zoloft, BuSpar and Xanax, none of which helped.  Headache is continued.  Scan was nonrevealing.  Dr. Lavell Anchors suggested possibility of sleep apnea contributing to headache and this led to the sleep studies.  So far she cannot tell any benefit from CPAP.  Had tried Excedrin migraine, then propranolol started 5 weeks ago which is seem to help some.  Added Imitrex and valerian. 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 Olga Coaster for late March.  I could  not tell from the timing if the septal deviation might have anything to do with her headache issues.  Prior to Admission medications   Medication Sig Start Date End Date Taking? Authorizing Provider  Cholecalciferol (VITAMIN [name]) 125 MCG (5000 UT) CAPS Take 5,000 Units by mouth every other day.   Yes Provider, Historical, Misty [name]  clonazePAM (KLONOPIN) 0.5 MG tablet 1 to 3 tabs for sleep as needed 02/21/22  Yes Misty [name], Misty Fuller [name], Misty [name]  esomeprazole (NEXIUM) 40 MG capsule Take 1 capsule by mouth daily.   Yes Provider, Historical, Misty [name]  ezetimibe (ZETIA) 10 MG tablet Take 10 mg by mouth daily.   Yes Provider, Historical, Misty [name]  Magnesium Gluconate 550 MG TABS Take by mouth.   Yes Provider, Historical, Misty [name]  Multiple Vitamins-Minerals (MULTIVITAMIN PO) Take 1 tablet by mouth daily.   Yes Provider, Historical, Misty [name]  propranolol ER (INDERAL LA) 60 MG 24 hr capsule Take 1 capsule (60 mg total) by mouth daily. 01/16/22  Yes Melvenia Beam, Misty [name]  SUMAtriptan (IMITREX) 50 MG tablet Take 1 tablet (50 mg total) by mouth every 2 (two) hours as needed for migraine. May repeat in 2 hours if headache persists or recurs. 02/13/22  Yes Suzzanne Cloud, NP  topiramate (TOPAMAX) 25 MG tablet Take 1 tablet at bedtime x 3 days, then take 2 tablets at bedtime x 3 days, then take 3 at bedtime 02/13/22  Yes Suzzanne Cloud, NP  traZODone (DESYREL) 50 MG tablet Take 50 mg by mouth at bedtime  as needed.   Yes Provider, Historical, Misty [name]  Valerian 250 MG CAPS 1 caps before bedtime po. 01/09/22  Yes Dohmeier, Asencion Partridge, Misty [name]  vitamin B-12 (CYANOCOBALAMIN) 500 MCG tablet Take by mouth.   Yes Provider, Historical, Misty [name]   Past Medical History:  Diagnosis Date   Allergy    Anxiety    Depression    GERD (gastroesophageal reflux disease)    GERD (gastroesophageal reflux disease)    Headache    Hiatal hernia    Hypercholesteremia    Insomnia    Osteopenia    Plantar fascia syndrome    Scoliosis    chronic pain   Past Surgical History:  Procedure  Laterality Date   COLONOSCOPY     excision of morton's neuroma     MYOMECTOMY  1996   PLANTAR FASCIA SURGERY     TONSILLECTOMY  1966   Family History  Problem Relation Age of Onset   Dementia Mother    Hypertension Mother    Glaucoma Mother    Heart disease Father    Rheum arthritis Father    Lupus Father    Aneurysm Father    CAD Father    Hypertension Father    Hyperlipidemia Father    Stroke Maternal Grandfather    Breast cancer Neg Hx    Colon cancer Neg Hx    Stomach cancer Neg Hx    Esophageal cancer Neg Hx    Rectal cancer Neg Hx    Migraines Neg Hx    Social History   Socioeconomic History   Marital status: Married    Spouse name: Not on file   Number of children: 0   Years of education: Not on file   Highest education level: Not on file  Occupational History   Occupation: non Airline pilot  Tobacco Use   Smoking status: Never   Smokeless tobacco: Never  Vaping Use   Vaping Use: Never used  Substance and Sexual Activity   Alcohol use: Yes    Comment: occ   Drug use: No   Sexual activity: Yes    Partners: Male  Other Topics Concern   Not on file  Social History Narrative   Not on file   Social Determinants of Health   Financial Resource Strain: Not on file  Food Insecurity: Not on file  Transportation Needs: Not on file  Physical Activity: Not on file  Stress: Not on file  Social Connections: Not on file  Intimate Partner Violence: Not on file   ROS-see HPI  + = positive Constitutional:    weight loss, night sweats, fevers, chills, fatigue, lassitude. HEENT:    +headaches, difficulty swallowing, tooth/dental problems, sore throat,       sneezing, itching, ear ache, nasal congestion, post nasal drip, snoring CV:    chest pain, orthopnea, PND, swelling in lower extremities, anasarca,                                   dizziness, palpitations Resp:   shortness of breath with exertion or at rest.                productive cough,    non-productive cough, coughing up of blood.              change in color of mucus.  wheezing.   Skin:    rash or lesions. GI:  No-   heartburn, indigestion, abdominal  pain, nausea, vomiting, diarrhea,                 change in bowel habits, loss of appetite GU: dysuria, change in color of urine, no urgency or frequency.   flank pain. MS:   joint pain, stiffness, decreased range of motion, back pain. Neuro-     nothing unusual Psych:  change in mood or affect.  depression or anxiety.   memory loss.  OBJ- Physical Exam General- Alert, Oriented, Affect+ distraught, tearful and frustrated by headaches and insomnia, Distress- none acute Skin- rash-none, lesions- none, excoriation- none Lymphadenopathy- none Head- atraumatic            Eyes- Gross vision intact, PERRLA, conjunctivae and secretions clear            Ears- Hearing, canals-normal            Nose- Clear, no-Septal dev, mucus, polyps, erosion, perforation             Throat- Mallampati II thin, posteriorly [laced soft palate, mucosa clear , drainage- none, tonsils- atrophic Neck- flexible , trachea midline, no stridor , thyroid nl, carotid no bruit Chest - symmetrical excursion , unlabored           Heart/CV- RRR , no murmur , no gallop  , no rub, nl s1 s2                           - JVD- none , edema- none, stasis changes- none, varices- none           Lung- clear to P&A, wheeze- none, cough- none , dullness-none, rub- none           Chest wall-  Abd-  Br/ Gen/ Rectal- Not done, not indicated Extrem- cyanosis- none, clubbing, none, atrophy- none, strength- nl Neuro- grossly intact to observation

## 2022-02-21 ENCOUNTER — Other Ambulatory Visit: Payer: Self-pay

## 2022-02-21 ENCOUNTER — Encounter: Payer: Self-pay | Admitting: Internal Medicine

## 2022-02-21 ENCOUNTER — Ambulatory Visit (INDEPENDENT_AMBULATORY_CARE_PROVIDER_SITE_OTHER): Payer: Medicare Other | Admitting: Internal Medicine

## 2022-02-21 DIAGNOSIS — Z9989 Dependence on other enabling machines and devices: Secondary | ICD-10-CM | POA: Diagnosis not present

## 2022-02-21 DIAGNOSIS — G43709 Chronic migraine without aura, not intractable, without status migrainosus: Secondary | ICD-10-CM | POA: Diagnosis not present

## 2022-02-21 DIAGNOSIS — G4733 Obstructive sleep apnea (adult) (pediatric): Secondary | ICD-10-CM | POA: Diagnosis not present

## 2022-02-21 DIAGNOSIS — F5101 Primary insomnia: Secondary | ICD-10-CM | POA: Diagnosis not present

## 2022-02-21 DIAGNOSIS — G4700 Insomnia, unspecified: Secondary | ICD-10-CM | POA: Insufficient documentation

## 2022-02-21 MED ORDER — CLONAZEPAM 0.5 MG PO TABS: ORAL_TABLET | ORAL | 1 refills | Status: DC

## 2022-02-21 NOTE — Assessment & Plan Note (Signed)
She says neurology has identified her headaches as migraine.  We will follow their lead, trying to establish CPAP control of sleep apnea to see if that helps.  Meanwhile we are going to try clonazepam instead of trazodone to see if longer half-life, anxiolytic and muscle relaxation features are helpful.

## 2022-02-21 NOTE — Assessment & Plan Note (Signed)
Hopefully she will benefit from CPAP.  It was reasonable to suggest that OSA might be causing her fragmented sleep and exacerbation of headache. Plan-we are awaiting download from her [employer].  Continuing current settings for now.

## 2022-02-21 NOTE — Patient Instructions (Signed)
Script sent for clonazepam   Try 1-3 tabs (0.5-1.5 mg) about 30 minutes before lights out    Try this instead of trazodone. Let me know what you think.

## 2022-02-21 NOTE — Assessment & Plan Note (Signed)
Described mostly as waking after sleep onset.  Trazodone did help for years but no longer. Plan-try replacing trazodone with long half-life clonazepam 0.5 mg up to 1.5 mg if needed.  Appropriate medication discussion and questions answered.

## 2022-02-25 ENCOUNTER — Encounter: Payer: Self-pay | Admitting: Neurology

## 2022-02-26 ENCOUNTER — Telehealth: Payer: Self-pay | Admitting: Neurology

## 2022-02-26 ENCOUNTER — Telehealth: Payer: Self-pay | Admitting: Internal Medicine

## 2022-02-26 MED ORDER — PROPRANOLOL HCL 20 MG PO TABS: ORAL_TABLET | ORAL | 0 refills | Status: DC

## 2022-02-26 NOTE — Telephone Encounter (Signed)
Certainly ok to stop clonazepam, or try just 1/2 tab. We can regroup after she gets back from her trip.

## 2022-02-26 NOTE — Telephone Encounter (Signed)
Patient is aware of below message and voiced her understanding.  Nothing further needed.   

## 2022-02-26 NOTE — Telephone Encounter (Signed)
I received an after-hours call message from the patient regarding her Inderal, she would like to taper off of it. Inderal IR 20 mg generic sent to pharmacy with taper instructions.

## 2022-02-26 NOTE — Telephone Encounter (Signed)
Spoke to patient.  She stated that she has been taking Klonopin 0.5 for 3 nights. The first night she tried 1 tablet without success, the second night she tried 2 tablets and she woke up in a fog and the third night she tried 0.5 tablet of Klonopin and 1 tablet of topamax. She woke up today still in a fog. She [place] 1 [place]. She is wanting to know if it is okay to stop Klonopin.  Dr. Annamaria Boots, please advise. Thanks

## 2022-03-13 ENCOUNTER — Other Ambulatory Visit: Payer: Self-pay

## 2022-03-13 ENCOUNTER — Ambulatory Visit: Payer: Medicare Other | Admitting: Neurology

## 2022-03-13 ENCOUNTER — Encounter: Payer: Self-pay | Admitting: Internal Medicine

## 2022-03-13 ENCOUNTER — Encounter (HOSPITAL_BASED_OUTPATIENT_CLINIC_OR_DEPARTMENT_OTHER): Payer: Self-pay | Admitting: Otolaryngology

## 2022-03-13 DIAGNOSIS — G4733 Obstructive sleep apnea (adult) (pediatric): Secondary | ICD-10-CM

## 2022-03-20 ENCOUNTER — Ambulatory Visit: Payer: Medicare Other | Admitting: Neurology

## 2022-03-25 ENCOUNTER — Other Ambulatory Visit: Payer: Self-pay

## 2022-03-25 ENCOUNTER — Ambulatory Visit (HOSPITAL_BASED_OUTPATIENT_CLINIC_OR_DEPARTMENT_OTHER): Payer: Medicare Other | Admitting: Anesthesiology

## 2022-03-25 ENCOUNTER — Ambulatory Visit (HOSPITAL_BASED_OUTPATIENT_CLINIC_OR_DEPARTMENT_OTHER)
Admission: RE | Admit: 2022-03-25 | Discharge: 2022-03-25 | Disposition: A | Discharge status: Home / Self Care | Payer: Medicare Other | Attending: Otolaryngology | Admitting: Otolaryngology

## 2022-03-25 ENCOUNTER — Encounter (HOSPITAL_BASED_OUTPATIENT_CLINIC_OR_DEPARTMENT_OTHER)
Admission: RE | Disposition: A | Discharge status: Home / Self Care | Payer: Self-pay | Source: Home / Self Care | Attending: Otolaryngology

## 2022-03-25 ENCOUNTER — Encounter (HOSPITAL_BASED_OUTPATIENT_CLINIC_OR_DEPARTMENT_OTHER): Payer: Self-pay | Admitting: Otolaryngology

## 2022-03-25 DIAGNOSIS — G4733 Obstructive sleep apnea (adult) (pediatric): Secondary | ICD-10-CM | POA: Diagnosis not present

## 2022-03-25 DIAGNOSIS — J343 Hypertrophy of nasal turbinates: Secondary | ICD-10-CM | POA: Diagnosis not present

## 2022-03-25 DIAGNOSIS — J342 Deviated nasal septum: Secondary | ICD-10-CM

## 2022-03-25 DIAGNOSIS — J3489 Other specified disorders of nose and nasal sinuses: Secondary | ICD-10-CM

## 2022-03-25 HISTORY — PX: NASAL SEPTOPLASTY W/ TURBINOPLASTY: SHX2070

## 2022-03-25 HISTORY — DX: Unspecified asthma, uncomplicated: J45.909

## 2022-03-25 HISTORY — DX: Sleep apnea, unspecified: G47.30

## 2022-03-25 SURGERY — SEPTOPLASTY, NOSE, WITH NASAL TURBINATE REDUCTION
Anesthesia: General | Site: Nose

## 2022-03-25 MED ORDER — LIDOCAINE-EPINEPHRINE 1 %-1:100000 IJ SOLN
INTRAMUSCULAR | Status: DC | PRN
  Administered 2022-03-25: 3.5 mL

## 2022-03-25 MED ORDER — AMISULPRIDE (ANTIEMETIC) 5 MG/2ML IV SOLN: 10.0000 mg | Freq: Once | INTRAVENOUS | Status: DC | PRN

## 2022-03-25 MED ORDER — OXYCODONE HCL 5 MG/5ML PO SOLN: 5.0000 mg | Freq: Once | ORAL | Status: DC | PRN

## 2022-03-25 MED ORDER — OXYCODONE HCL 5 MG PO TABS: 5.0000 mg | ORAL_TABLET | Freq: Once | ORAL | Status: DC | PRN

## 2022-03-25 MED ORDER — ACETAMINOPHEN 500 MG PO TABS
1000.0000 mg | ORAL_TABLET | Freq: Once | ORAL | Status: AC
  Administered 2022-03-25: 1000 mg via ORAL

## 2022-03-25 MED ORDER — LACTATED RINGERS IV SOLN
INTRAVENOUS | Status: DC
Start: 2022-03-25 — End: 2022-03-25

## 2022-03-25 MED ORDER — SUGAMMADEX SODIUM 500 MG/5ML IV SOLN
INTRAVENOUS | Status: DC | PRN
  Administered 2022-03-25: 300 mg via INTRAVENOUS

## 2022-03-25 MED ORDER — LIDOCAINE HCL (CARDIAC) PF 100 MG/5ML IV SOSY
PREFILLED_SYRINGE | INTRAVENOUS | Status: DC | PRN
  Administered 2022-03-25: 60 mg via INTRAVENOUS

## 2022-03-25 MED ORDER — ONDANSETRON HCL 4 MG/2ML IJ SOLN
INTRAMUSCULAR | Status: DC | PRN
  Administered 2022-03-25: 4 mg via INTRAVENOUS

## 2022-03-25 MED ORDER — MIDAZOLAM HCL 5 MG/5ML IJ SOLN
INTRAMUSCULAR | Status: DC | PRN
  Administered 2022-03-25: 2 mg via INTRAVENOUS

## 2022-03-25 MED ORDER — ACETAMINOPHEN 500 MG PO TABS
ORAL_TABLET | ORAL | Status: AC
  Filled 2022-03-25: qty 2

## 2022-03-25 MED ORDER — ONDANSETRON HCL 4 MG/2ML IJ SOLN: 4.0000 mg | Freq: Once | INTRAMUSCULAR | Status: DC | PRN

## 2022-03-25 MED ORDER — DEXAMETHASONE SODIUM PHOSPHATE 4 MG/ML IJ SOLN
INTRAMUSCULAR | Status: DC | PRN
Start: 2022-03-25 — End: 2022-03-25
  Administered 2022-03-25: 4 mg via INTRAVENOUS

## 2022-03-25 MED ORDER — PROPOFOL 10 MG/ML IV BOLUS
INTRAVENOUS | Status: DC | PRN
  Administered 2022-03-25: 150 mg via INTRAVENOUS

## 2022-03-25 MED ORDER — MUPIROCIN 2 % EX OINT
TOPICAL_OINTMENT | CUTANEOUS | Status: DC | PRN
  Administered 2022-03-25: 1 via NASAL

## 2022-03-25 MED ORDER — FENTANYL CITRATE (PF) 100 MCG/2ML IJ SOLN: 25.0000 ug | INTRAMUSCULAR | Status: DC | PRN

## 2022-03-25 MED ORDER — OXYCODONE-ACETAMINOPHEN 5-325 MG PO TABS: 1.0000 | ORAL_TABLET | ORAL | 0 refills | Status: AC | PRN

## 2022-03-25 MED ORDER — ROCURONIUM BROMIDE 100 MG/10ML IV SOLN
INTRAVENOUS | Status: DC | PRN
  Administered 2022-03-25: 50 mg via INTRAVENOUS

## 2022-03-25 MED ORDER — CEFAZOLIN SODIUM-DEXTROSE 2-3 GM-%(50ML) IV SOLR
INTRAVENOUS | Status: DC | PRN
  Administered 2022-03-25: 2 g via INTRAVENOUS

## 2022-03-25 MED ORDER — OXYMETAZOLINE HCL 0.05 % NA SOLN
NASAL | Status: DC | PRN
  Administered 2022-03-25: 1 via TOPICAL

## 2022-03-25 MED ORDER — FENTANYL CITRATE (PF) 100 MCG/2ML IJ SOLN
INTRAMUSCULAR | Status: DC | PRN
  Administered 2022-03-25: 100 ug via INTRAVENOUS

## 2022-03-25 MED ORDER — AMOXICILLIN 875 MG PO TABS
875.0000 mg | ORAL_TABLET | Freq: Two times a day (BID) | ORAL | 0 refills | Status: AC
Start: 2022-03-25 — End: 2022-03-28

## 2022-03-25 SURGICAL SUPPLY — 36 items
ATTRACTOMAT 16X20 MAGNETIC DRP (DRAPES) IMPLANT
CANISTER SUCT 1200ML W/VALVE (MISCELLANEOUS) ×2 IMPLANT
COAGULATOR SUCT 8FR VV (MISCELLANEOUS) ×2 IMPLANT
DEFOGGER MIRROR 1QT (MISCELLANEOUS) ×2 IMPLANT
DRSG NASOPORE 8CM (GAUZE/BANDAGES/DRESSINGS) IMPLANT
DRSG TELFA 3X8 NADH (GAUZE/BANDAGES/DRESSINGS) IMPLANT
ELECT REM PT RETURN 9FT ADLT (ELECTROSURGICAL) ×2
ELECTRODE REM PT RTRN 9FT ADLT (ELECTROSURGICAL) ×1 IMPLANT
GLOVE SURG ENC MOIS LTX SZ7.5 (GLOVE) ×2 IMPLANT
GLOVE SURG POLYISO LF SZ7 (GLOVE) ×1 IMPLANT
GLOVE SURG SYN 7.5  E (GLOVE) ×2
GLOVE SURG SYN 7.5 E (GLOVE) ×1 IMPLANT
GLOVE SURG SYN 7.5 PF PI (GLOVE) IMPLANT
GLOVE SURG UNDER POLY LF SZ7 (GLOVE) ×1 IMPLANT
GOWN STRL REUS W/ TWL LRG LVL3 (GOWN DISPOSABLE) ×2 IMPLANT
GOWN STRL REUS W/TWL LRG LVL3 (GOWN DISPOSABLE) ×4
NDL HYPO 25X1 1.5 SAFETY (NEEDLE) ×1 IMPLANT
NEEDLE HYPO 25X1 1.5 SAFETY (NEEDLE) ×2 IMPLANT
NS IRRIG 1000ML POUR BTL (IV SOLUTION) ×2 IMPLANT
PACK BASIN DAY SURGERY FS (CUSTOM PROCEDURE TRAY) ×2 IMPLANT
PACK ENT DAY SURGERY (CUSTOM PROCEDURE TRAY) ×2 IMPLANT
PAD DRESSING TELFA 3X8 NADH (GAUZE/BANDAGES/DRESSINGS) IMPLANT
SLEEVE SCD COMPRESS KNEE MED (STOCKING) IMPLANT
SPIKE FLUID TRANSFER (MISCELLANEOUS) ×1 IMPLANT
SPLINT NASAL AIRWAY SILICONE (MISCELLANEOUS) ×2 IMPLANT
SPONGE GAUZE 2X2 8PLY STRL LF (GAUZE/BANDAGES/DRESSINGS) ×2 IMPLANT
SPONGE NEURO XRAY DETECT 1X3 (DISPOSABLE) ×2 IMPLANT
SUT CHROMIC 4 0 P 3 18 (SUTURE) ×2 IMPLANT
SUT PLAIN 4 0 ~~LOC~~ 1 (SUTURE) ×2 IMPLANT
SUT PROLENE 3 0 PS 2 (SUTURE) ×2 IMPLANT
SUT VIC AB 4-0 P-3 18XBRD (SUTURE) IMPLANT
SUT VIC AB 4-0 P3 18 (SUTURE)
TOWEL GREEN STERILE FF (TOWEL DISPOSABLE) ×2 IMPLANT
TUBE SALEM SUMP 12R W/ARV (TUBING) IMPLANT
TUBE SALEM SUMP 16 FR W/ARV (TUBING) ×2 IMPLANT
YANKAUER SUCT BULB TIP NO VENT (SUCTIONS) ×2 IMPLANT

## 2022-03-25 NOTE — Anesthesia Preprocedure Evaluation (Signed)
Anesthesia Evaluation  ?Patient identified by MRN, date of birth, ID band ?Patient awake ? ? ? ?Reviewed: ?Allergy & Precautions, NPO status , Patient's Chart, lab work & pertinent test results, reviewed documented beta blocker date and time  ? ?History of Anesthesia Complications ?Negative for: history of anesthetic complications ? ?Airway ?Mallampati: III ? ?TM Distance: >3 FB ?Neck ROM: Full ? ? ? Dental ? ?(+) Teeth Intact, Dental Advisory Given ?Permanent retainer on bottom:   ?Pulmonary ?asthma , sleep apnea ,  ?  ?Pulmonary exam normal ? ? ? ? ? ? ? Cardiovascular ?negative cardio ROS ?Normal cardiovascular exam ? ? ?  ?Neuro/Psych ? Headaches, Anxiety Depression   ? GI/Hepatic ?Neg liver ROS, hiatal hernia, GERD  ,  ?Endo/Other  ?negative endocrine ROS ? Renal/GU ?negative Renal ROS  ?negative genitourinary ?  ?Musculoskeletal ?negative musculoskeletal ROS ?(+)  ? Abdominal ?  ?Peds ? Hematology ?negative hematology ROS ?(+)   ?Anesthesia Other Findings ? ? Reproductive/Obstetrics ? ?  ? ? ? ? ? ? ? ? ? ? ? ? ? ?  ?  ? ? ? ? ? ? ? ? ?Anesthesia Physical ?Anesthesia Plan ? ?ASA: 2 ? ?Anesthesia Plan: General  ? ?Post-op Pain Management: Tylenol PO (pre-op)* and Toradol IV (intra-op)*  ? ?Induction: Intravenous ? ?PONV Risk Score and Plan: 3 and Ondansetron, Dexamethasone, Treatment may vary due to age or medical condition and Midazolam ? ?Airway Management Planned: Oral ETT ? ?Additional Equipment: None ? ?Intra-op Plan:  ? ?Post-operative Plan: Extubation in OR ? ?Informed Consent: I have reviewed the patients History and Physical, chart, labs and discussed the procedure including the risks, benefits and alternatives for the proposed anesthesia with the patient or authorized representative who has indicated his/her understanding and acceptance.  ? ? ? ?Dental advisory given ? ?Plan Discussed with:  ? ?Anesthesia Plan Comments:   ? ? ? ? ? ? ?Anesthesia Quick Evaluation ? ?

## 2022-03-25 NOTE — Discharge Instructions (Addendum)
?Post Anesthesia Home Care Instructions ? ?Activity: ?Get plenty of rest for the remainder of the day. A responsible individual must stay with you for 24 hours following the procedure.  ?For the next 24 hours, DO NOT: ?-Drive a car ?-Paediatric nurse ?-Drink alcoholic beverages ?-Take any medication unless instructed by your physician ?-Make any legal decisions or sign important papers. ? ?Meals: ?Start with liquid foods such as gelatin or soup. Progress to regular foods as tolerated. Avoid greasy, spicy, heavy foods. If nausea and/or vomiting occur, drink only clear liquids until the nausea and/or vomiting subsides. Call your physician if vomiting continues. ? ?Special Instructions/Symptoms: ?Your throat may feel dry or sore from the anesthesia or the breathing tube placed in your throat during surgery. If this causes discomfort, gargle with warm salt water. The discomfort should disappear within 24 hours. ? ?If you had a scopolamine patch placed behind your ear for the management of post- operative nausea and/or vomiting: ? ?1. The medication in the patch is effective for 72 hours, after which it should be removed.  Wrap patch in a tissue and discard in the trash. Wash hands thoroughly with soap and water. ?2. You may remove the patch earlier than 72 hours if you experience unpleasant side effects which may include dry mouth, dizziness or visual disturbances. ?3. Avoid touching the patch. Wash your hands with soap and water after contact with the patch. ?   Next tylenol dose 12:45 pm ? ?------------------- ? ?POSTOPERATIVE INSTRUCTIONS FOR PATIENTS HAVING NASAL OR SINUS OPERATIONS ?ACTIVITY: Restrict activity at home for the first two days, resting as much as possible. Light activity is best. You may usually return to work within a week. You should refrain from nose blowing, strenuous activity, or heavy lifting greater than 20lbs for a total of one week after your operation.  If sneezing cannot be avoided,  sneeze with your mouth open. ?DISCOMFORT: You may experience a dull headache and pressure along with nasal congestion and discharge. These symptoms may be worse during the first week after the operation but may last as long as two to four weeks.  Please take Tylenol or the pain medication that has been prescribed for you. Do not take aspirin or aspirin containing medications since they may cause bleeding.  You may experience symptoms of post nasal drainage, nasal congestion, headaches and fatigue for two or three months after your operation.  ?BLEEDING: You may have some blood tinged nasal drainage for approximately two weeks after the operation.  The discharge will be worse for the first week.  Please call our office at [phone] or go to the nearest hospital emergency room if you experience any of the following: heavy, bright red blood from your nose or mouth that lasts longer than 15 minutes or coughing up or vomiting bright red blood or blood clots. ?GENERAL CONSIDERATIONS: ?A gauze dressing will be placed on your upper lip to absorb any drainage after the operation. You may need to change this several times a day.  If you do not have very much drainage, you may remove the dressing.  Remember that you may gently wipe your nose with a tissue and sniff in, but DO NOT blow your nose. ?Please keep all of your postoperative appointments.  Your final results after the operation will depend on proper follow-up.  The initial visit is usually 2 to 5 days after the operation.  During this visit, the remaining nasal packing and internal septal splints will be removed.  Your nasal  and sinus cavities will be cleaned.  During the second visit, your nasal and sinus cavities will be cleaned again. Have someone drive you to your first two postoperative appointments.  ?How you care for your nose after the operation will influence the results that you obtain.  You should follow all directions, take your medication as  prescribed, and call our office [phone] with any problems or questions. ?You may be more comfortable sleeping with your head elevated on two pillows. ?Do not take any medications that we have not prescribed or recommended. ?WARNING SIGNS: if any of the following should occur, please call our office: ?Persistent fever greater than 102F. ?Persistent vomiting. ?Severe and constant pain that is not relieved by prescribed pain medication. ?Trauma to the nose. ?Rash or unusual side effects from any medicines.  ?

## 2022-03-25 NOTE — Op Note (Signed)
DATE OF PROCEDURE: 03/25/2022 ? ?OPERATIVE REPORT  ? ?SURGEON: Leta Baptist, MD  ? ?PREOPERATIVE DIAGNOSES:  ?1. Severe nasal septal deviation.  ?2. Bilateral inferior turbinate hypertrophy.  ?3. Chronic nasal obstruction. ? ?POSTOPERATIVE DIAGNOSES:  ?1. Severe nasal septal deviation.  ?2. Bilateral inferior turbinate hypertrophy.  ?3. Chronic nasal obstruction. ? ?PROCEDURE PERFORMED:  ?1. Septoplasty.  ?2. Bilateral partial inferior turbinate resection.  ? ?ANESTHESIA: General endotracheal tube anesthesia.  ? ?COMPLICATIONS: None.  ? ?ESTIMATED BLOOD LOSS: 100 mL.  ? ?INDICATION FOR PROCEDURE: Misty [name] is a 71 y.o. female with a history of chronic nasal obstruction. The patient was treated with allergy medications and steroid nasal sprays. However, the patient continued to be symptomatic. On examination, the patient was noted to have bilateral severe inferior turbinate hypertrophy and significant nasal septal deviation, causing significant nasal obstruction. Based on the above findings, the decision was made for the patient to undergo the above-stated procedures. The risks, benefits, alternatives, and details of the procedures were discussed with the patient. Questions were invited and answered. Informed consent was obtained.  ? ?DESCRIPTION OF PROCEDURE: The patient was taken to the operating room and placed supine on the operating table. General endotracheal tube anesthesia was administered by the anesthesiologist. The patient was positioned, and prepped and draped in the standard fashion for nasal surgery. Pledgets soaked with Afrin were placed in both nasal cavities for decongestion. The pledgets were subsequently removed.  ? ?Examination of the nasal cavity revealed a severe nasal septal deviation. 1% lidocaine with 1:100,000 epinephrine was injected onto the nasal septum bilaterally. A hemitransfixion incision was made on the left side. The mucosal flap was carefully elevated on the left side. A  cartilaginous incision was made 1 cm superior to the caudal margin of the nasal septum. Mucosal flap was also elevated on the right side in the similar fashion. It should be noted that due to the severe septal deviation, the deviated portion of the cartilaginous and bony septum had to be removed in piecemeal fashion. Once the deviated portions were removed, a straight midline septum was achieved. The septum was then quilted with 4-0 plain gut sutures. The hemitransfixion incision was closed with interrupted 4-0 chromic sutures.  ? ?The inferior one half of both hypertrophied inferior turbinate was crossclamped with a Kelly clamp. The inferior one half of each inferior turbinate was then resected with a pair of cross cutting scissors. Hemostasis was achieved with a suction cautery device.  Doyle splints were applied to the nasal septum. ? ?The care of the patient was turned over to the anesthesiologist. The patient was awakened from anesthesia without difficulty. The patient was extubated and transferred to the recovery room in good condition.  ? ?OPERATIVE FINDINGS: Severe nasal septal deviation and bilateral inferior turbinate hypertrophy.  ? ?SPECIMEN: None.  ? ?FOLLOWUP CARE: The patient be discharged home once she is awake and alert. The patient will be placed on Percocet p.r.n. pain, and amoxicillin 875 mg p.o. b.i.d. for 3 days. The patient will follow up in my office in 3 days for splint removal.  ? ?Misty Mussell Raynelle Bring, MD  ?

## 2022-03-25 NOTE — H&P (Signed)
CC: Chronic nasal obstruction ? ?HPI: ?The patient is a 71 year old female who presents today complaining of chronic nasal obstruction and chronic right-sided facial pain.  The patient was previously seen in 2019 for similar complaints.  At that time, she was noted to have severe nasal septal deviation, impinging on the right lateral nasal wall.  She was diagnosed with a possible contact point headache.  The patient was treated with Flonase nasal spray and allergy medications without improvement in her symptoms.  The patient has a history of obstructive sleep apnea.  She is currently using a CPAP machine at night.  However, she has significant difficulty using the CPAP due to her nasal obstruction.  She also has a history of migraine headaches. She has been using propranolol as a maintenance medication.  The patient currently uses Nasacort nasal spray daily.  She previously underwent tonsillectomy surgery in 1966.  She has no history of sinonasal surgery.   ? ?The patient's review of systems (constitutional, eyes, ENT, cardiovascular, respiratory, GI, musculoskeletal, skin, neurologic, psychiatric, endocrine, hematologic, allergic) is noted in the ROS questionnaire.  It is reviewed with the patient. ? ?Major events: Uterine myomectomy, tonsillectomy.  ?Ongoing medical problems: Reflux, migraine, headache, migraine, depression, hay fever, allergic asthma, sleep apnea using CPAP.  ?Family health history: Heart disease, hypertension.  ?Social history: The patient is married. She denies the use of tobacco or illegal drugs. She drinks alcohol one a week.   ? ?Exam: ?General: Communicates without difficulty, well nourished, no acute distress. Head: Normocephalic, no evidence injury, no tenderness, facial buttresses intact without stepoff. Face/sinus: No tenderness to palpation and percussion. Facial movement is normal and symmetric. Eyes: PERRL, EOMI. No scleral icterus, conjunctivae clear. Neuro: CN II exam reveals  vision grossly intact.  No nystagmus at any point of gaze. Ears: Auricles well formed without lesions.  Ear canals are intact without mass or lesion.  No erythema or edema is appreciated.  The TMs are intact without fluid. Nose: External evaluation reveals normal support and skin without lesions.  Dorsum is intact.  Anterior rhinoscopy reveals congested mucosa over anterior aspect of inferior turbinates and intact septum.  No purulence noted. Oral:  Oral cavity and oropharynx are intact, symmetric, without erythema or edema.  Mucosa is moist without lesions. Neck: Full range of motion without pain.  There is no significant lymphadenopathy.  No masses palpable.  Thyroid bed within normal limits to palpation.  Parotid glands and submandibular glands equal bilaterally without mass.  Trachea is midline. Neuro:  CN 2-12 grossly intact. Gait normal. A flexible scope was inserted into the right nasal cavity.  Endoscopy of the interior nasal cavity, superior, inferior, and middle meatus was performed. The sphenoid-ethmoid recess was examined. Edematous mucosa was noted.  No polyp, mass, or lesion was appreciated. Nasal septal deviation noted.  A large septal spur was noted.  Olfactory cleft was clear.  Nasopharynx was clear.  Turbinates were hypertrophied but without mass.  The procedure was repeated on the contralateral side with similar findings.   ? ?Assessment  ?1.  Chronic nasal obstruction, secondary to nasal mucosal congestion, severe nasal septal deviation, and bilateral inferior turbinate hypertrophy.  ?2.  The patient has a large septal spur on the right side, impinging on the right lateral nasal wall.  This may be a cause of her headache.   ?3.  No polyps, mass, lesion or purulent drainage is noted today.  ? ?Plan  ?1.  The physical exam and nasal endoscopy findings are reviewed  with the patient.  ?2.  She should continue with her Nasacort nasal spray daily.   ?3.  Nasal saline irrigation is encouraged.   ?4.  In  light of her persistent symptoms, she may benefit from undergoing surgical intervention with septoplasty and bilateral turbinate reduction.  The risks, benefits, alternatives and details of the procedure are reviewed with the patient.  Questions are invited and answered.  ?5.  The patient would like to proceed with the septoplasty and turbinate reduction procedures.  ? ? ?

## 2022-03-25 NOTE — Transfer of Care (Signed)
Immediate Anesthesia Transfer of Care Note ? ?Patient: Misty [name] ? ?Procedure(s) Performed: NASAL SEPTOPLASTY WITH TURBINATE REDUCTION (Nose) ? ?Patient Location: PACU ? ?Anesthesia Type:General ? ?Level of Consciousness: awake, alert  and oriented ? ?Airway & Oxygen Therapy: Patient Spontanous Breathing and Patient connected to face mask oxygen ? ?Post-op Assessment: Report given to RN and Post -op Vital signs reviewed and stable ? ?Post vital signs: Reviewed and stable ? ?Last Vitals:  ?Vitals Value Taken Time  ?BP 165/83 03/25/22 1000  ?Temp    ?Pulse 69 03/25/22 1001  ?Resp 11 03/25/22 1001  ?SpO2 97 % 03/25/22 1001  ?Vitals shown include unvalidated device data. ? ?Last Pain:  ?Vitals:  ? 03/25/22 0738  ?TempSrc: Oral  ?PainSc: 0-No pain  ?   ? ?  ? ?Complications: No notable events documented. ?

## 2022-03-25 NOTE — Anesthesia Postprocedure Evaluation (Signed)
Anesthesia Post Note ? ?Patient: Navy Rothschild ? ?Procedure(s) Performed: NASAL SEPTOPLASTY WITH TURBINATE REDUCTION (Nose) ? ?  ? ?Patient location during evaluation: PACU ?Anesthesia Type: General ?Level of consciousness: awake and alert ?Pain management: pain level controlled ?Vital Signs Assessment: post-procedure vital signs reviewed and stable ?Respiratory status: spontaneous breathing, nonlabored ventilation and respiratory function stable ?Cardiovascular status: blood pressure returned to baseline and stable ?Postop Assessment: no apparent nausea or vomiting ?Anesthetic complications: no ? ? ?No notable events documented. ? ?Last Vitals:  ?Vitals:  ? 03/25/22 1030 03/25/22 1045  ?BP: 125/68 (!) 167/89  ?Pulse: 65   ?Resp: 10 16  ?Temp:  (!) 36.1 ?C  ?SpO2: 96% 95%  ?  ?Last Pain:  ?Vitals:  ? 03/25/22 1045  ?TempSrc:   ?PainSc: 0-No pain  ? ? ?  ?  ?  ?  ?  ?  ? ?Lidia Collum ? ? ? ? ?

## 2022-03-26 ENCOUNTER — Encounter (HOSPITAL_BASED_OUTPATIENT_CLINIC_OR_DEPARTMENT_OTHER): Payer: Self-pay | Admitting: Otolaryngology

## 2022-04-01 ENCOUNTER — Encounter (HOSPITAL_BASED_OUTPATIENT_CLINIC_OR_DEPARTMENT_OTHER): Payer: Self-pay | Admitting: Internal Medicine

## 2022-04-01 ENCOUNTER — Telehealth: Payer: Self-pay | Admitting: Gastroenterology

## 2022-04-01 NOTE — Telephone Encounter (Signed)
Hi Dr. Tarri Glenn, patient called to schedule colonoscopy and is requesting a transfer of care back to Dr. Carlean Purl stated it is just her preference.  ? ?Please advise on scheduling ? ?Thank You ?

## 2022-04-02 ENCOUNTER — Telehealth: Payer: Self-pay | Admitting: Internal Medicine

## 2022-04-02 NOTE — Telephone Encounter (Signed)
Dr. Annamaria Boots, please see pt's email. Pt has attached her cpap download to review. Thanks.  ?

## 2022-04-02 NOTE — Telephone Encounter (Signed)
Called and updated patient with Dr Bertrum Sol response. Advised her that if she has issues with depression while being on the medication to call us and let us know and that Dr Annamaria Boots would try to find something else to help her. Nothing further needed  ?

## 2022-04-02 NOTE — Telephone Encounter (Signed)
Called patient and she is just wanting to make sure that its ok for her to take 1.5 tablets of Klonapin instead of the whole 2 tablets. She states that with 2 tablets she gets really sad and depressive and just wants to ask about if this is normal with her being on this dosage.  ? ?D Young please advise  ?

## 2022-04-02 NOTE — Telephone Encounter (Signed)
Perfectly ok to reduce dosage. If it stays uncomfortable I will try to find something else. ?

## 2022-04-03 NOTE — Telephone Encounter (Signed)
CPAP compliance 94.8%, AHI 0.5/ hr. Very good. Agree with [facility] to [facility] for [facility] fitting. ?

## 2022-04-08 ENCOUNTER — Encounter: Payer: Self-pay | Admitting: Neurology

## 2022-04-10 ENCOUNTER — Encounter: Payer: Self-pay | Admitting: Neurology

## 2022-04-10 MED ORDER — AJOVY 225 MG/1.5ML ~~LOC~~ SOAJ: 225.0000 mg | SUBCUTANEOUS | 11 refills | Status: DC

## 2022-04-10 NOTE — Telephone Encounter (Signed)
Athena Jandreau KeyLoyal Buba - PA Case ID: [id number] Need help? Call us at [phone]  [facility].  ?Outcome ?Approvedtoday AJOVY '225mg'$  /1.68m ?CHKGOVP:[id number];ELYHTM:BPJPETKKReview Type:Prior Auth;Coverage Start Date:03/11/2022;Coverage End Date:04/10/2023; ? ?I spoke to pt and she was also asking about does she need to keep on propranolol for a little while until ajovy in system.  She will be out this week. Of medication.  I placed order for the ajovy for her.  ?

## 2022-04-10 NOTE — Addendum Note (Signed)
Addended by: Brandon Melnick on: 04/10/2022 05:22 PM ? ? Modules accepted: Orders ? ?

## 2022-04-11 ENCOUNTER — Telehealth: Payer: Self-pay | Admitting: Neurology

## 2022-04-11 MED ORDER — PROPRANOLOL HCL 20 MG PO TABS: 20.0000 mg | ORAL_TABLET | Freq: Every day | ORAL | 1 refills | Status: DC

## 2022-04-11 MED ORDER — AJOVY 225 MG/1.5ML ~~LOC~~ SOAJ: 225.0000 mg | SUBCUTANEOUS | 0 refills | Status: DC

## 2022-04-11 NOTE — Telephone Encounter (Signed)
Sample order placed for the Ajovy 225 mg pen x 3.  ?

## 2022-04-11 NOTE — Telephone Encounter (Signed)
Spoke with pt via mychart

## 2022-04-11 NOTE — Addendum Note (Signed)
Addended by: Gildardo Griffes on: 04/11/2022 03:38 PM ? ? Modules accepted: Orders ? ?

## 2022-04-11 NOTE — Telephone Encounter (Signed)
Sarah NP ok with patient taking Propranolol 20 mg daily. Sent rx to pharmacy. ?

## 2022-04-11 NOTE — Telephone Encounter (Signed)
Called pt & LVM (ok per DPR) asking for call or mychart message back to schedule her nurse visit. Advised if we do the injection for her there is a charge for the injection. I have also advised the pt of Judson Roch NP's instructions below that answers pt's question about the Topamax and Propranolol. Left office number in message.  ? ?From Sarah NP:  would stay on the propranolol and Topamax at the current dose for at least the next 2 weeks until you can get the [place] in your system. If we stop the propanolol and Topamax altogether before giving the Ajovy some time to work I worry you may have quite significant and frequent headache.  If however you start the Ajovy, and your headaches disappear I think we could stop the propanolol and Topamax altogether 1 at a time.  So to answer your question, I would continue the propanolol 10 mg daily, Topamax 25 mg daily.  ?

## 2022-04-11 NOTE — Telephone Encounter (Signed)
Pt returned the missed phone call. Would like a call in return.  ?

## 2022-04-11 NOTE — Addendum Note (Signed)
Addended by: Gildardo Griffes on: 04/11/2022 03:11 PM ? ? Modules accepted: Orders ? ?

## 2022-04-11 NOTE — Telephone Encounter (Signed)
Pt wanted to make sure her My chart message was seen. Pt would like a call back. ?[phone] ? ?

## 2022-04-15 ENCOUNTER — Ambulatory Visit (INDEPENDENT_AMBULATORY_CARE_PROVIDER_SITE_OTHER): Payer: Medicare Other | Admitting: *Deleted

## 2022-04-15 DIAGNOSIS — G43709 Chronic migraine without aura, not intractable, without status migrainosus: Secondary | ICD-10-CM | POA: Diagnosis not present

## 2022-04-15 DIAGNOSIS — Z7689 Persons encountering health services in other specified circumstances: Secondary | ICD-10-CM

## 2022-04-15 NOTE — Progress Notes (Signed)
Patient and husband came to office for a nurse visit for [facility] education and administration. Patient and husband were both educated on correct administration of the Ajovy and were given instructions to take home as well as two additional injections to inject on May 15, 2022 and June 15, 2022. Aware of refrigeration of medication. Their questions were answered. I administered one initial injection of 225 mg into the pt's L upper arm. Pt tolerated well and a latex free bandaid was applied. Order in chart for samples.  ? ?LOT: [id number] ?EXP: [id number] ?

## 2022-04-16 NOTE — Telephone Encounter (Signed)
Hi Dr. Carlean Purl, ? ?Is this patient able to transfer her care back to you? Please advise.  ? ?Thank you.  ?

## 2022-04-16 NOTE — Telephone Encounter (Signed)
ok 

## 2022-04-17 ENCOUNTER — Encounter: Payer: Self-pay | Admitting: Internal Medicine

## 2022-04-18 MED ORDER — PROPRANOLOL HCL 20 MG PO TABS: 20.0000 mg | ORAL_TABLET | Freq: Two times a day (BID) | ORAL | 1 refills | Status: DC

## 2022-04-18 NOTE — Telephone Encounter (Signed)
Prescription update sent to pharmacy. Propranolol 20 mg BID #60 refill 1.  ?

## 2022-04-18 NOTE — Addendum Note (Signed)
Addended by: Gildardo Griffes on: 04/18/2022 01:02 PM ? ? Modules accepted: Orders ? ?

## 2022-04-22 ENCOUNTER — Encounter: Payer: Self-pay | Admitting: *Deleted

## 2022-04-22 DIAGNOSIS — U071 COVID-19: Secondary | ICD-10-CM | POA: Diagnosis not present

## 2022-04-24 ENCOUNTER — Ambulatory Visit (HOSPITAL_BASED_OUTPATIENT_CLINIC_OR_DEPARTMENT_OTHER): Payer: Medicare Other | Attending: Internal Medicine | Admitting: Internal Medicine

## 2022-04-24 DIAGNOSIS — G4733 Obstructive sleep apnea (adult) (pediatric): Secondary | ICD-10-CM

## 2022-04-25 ENCOUNTER — Encounter: Payer: Self-pay | Admitting: Neurology

## 2022-04-29 DIAGNOSIS — Z20822 Contact with and (suspected) exposure to covid-19: Secondary | ICD-10-CM | POA: Diagnosis not present

## 2022-04-29 MED ORDER — PROPRANOLOL HCL ER 60 MG PO CP24: 60.0000 mg | ORAL_CAPSULE | Freq: Every day | ORAL | 0 refills | Status: DC

## 2022-04-29 MED ORDER — PROPRANOLOL HCL ER 60 MG PO CP24
60.0000 mg | ORAL_CAPSULE | Freq: Every day | ORAL | 1 refills | Status: DC
Start: 2022-04-29 — End: 2022-04-29

## 2022-04-29 NOTE — Addendum Note (Signed)
Addended by: Gildardo Griffes on: 04/29/2022 11:39 AM ? ? Modules accepted: Orders ? ?

## 2022-05-01 DIAGNOSIS — H5213 Myopia, bilateral: Secondary | ICD-10-CM | POA: Diagnosis not present

## 2022-05-01 DIAGNOSIS — R519 Headache, unspecified: Secondary | ICD-10-CM | POA: Diagnosis not present

## 2022-05-01 DIAGNOSIS — H52203 Unspecified astigmatism, bilateral: Secondary | ICD-10-CM | POA: Diagnosis not present

## 2022-05-01 DIAGNOSIS — H2513 Age-related nuclear cataract, bilateral: Secondary | ICD-10-CM | POA: Diagnosis not present

## 2022-05-09 ENCOUNTER — Other Ambulatory Visit: Payer: Self-pay

## 2022-05-09 NOTE — Progress Notes (Signed)
Orders pended

## 2022-05-11 NOTE — Progress Notes (Signed)
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 Former patient last seen 2016 w hx  Insomnia, Allergic Rhinitis, Asthmatic Bronchitis. Self-referred back. ?Medical problem list includes Migraine, GERD,  Headache,OSA,  ?HST 09/27/21 ([place])  AHI 15.8/ hr, REM AHI 42.4/ hr, Desaturation to 84%, 176 lbs ?     Started CPAP auto 6-16, EPR 2   [facility]/ [facility] new Nov  2022 ?CPAP titration 01/28/22- 6 cwp AHI 0/ hr. ?Download ([employer]) compliance 92.2%, AHI 0.4/ hr ?Meds include Trazodone 50, Inderal,  ?Epworth score-4 ?Body weight today-178 lbs ?Covid vax-4 Phizer ?Flu vax-had ?-----Patient was last seen in 2016. Currently has a Oceanographer. Was set up with it in November 2022. Did HST and in lab sleep study. States that she has really bad headaches and does not sleep good at night. She has not noticed a difference since getting CPAP. ?Remote CBT therapist. ?She was referred to Dr. Ahearn/neurology because of headache.  Trazodone had worked well for insomnia up until about a year and a half ago.  She usually has no trouble falling asleep but then frequent waking after sleep onset.  She was tried on Effexor but stopped because of head jerks which resolved off Effexor.  Weaned herself off over several months.  Later-October-insomnia got worse.  Her PCP tried Xanax which did not help but at that time headaches got worse.  Felt depressed off Effexor.  Tried Lexapro which made headache even worse.  Has tried Zoloft, BuSpar and Xanax, none of which helped.  Headache is continued.  Scan was nonrevealing.  Dr. Lavell Anchors suggested possibility of sleep apnea contributing to headache and this led to the sleep studies.  So far she cannot tell any benefit from CPAP.  Had tried Excedrin migraine, then propranolol started 5 weeks ago which is seem to help some.  Added Imitrex and valerian. ?She is scheduled for septoplasty to repair traumatic septal deviation, by Dr. Olga Coaster for late March.  I could  not tell from the timing if the septal deviation might have anything to do with her headache issue ? 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 Former patient last seen 2016 w hx  Desiree Lucy,  Allergic Rhinitis, Asthmatic Bronchitis. Self-referred back. ?Medical problem list includes Migraine, GERD,  Headache,OSA,  ?CPAPauto 6-16/ [facility]/ [facility]  new Nov  2022 ?Download-compliance 94.9%, AHI 0.4/ hr ?Body weight today-175 lbs ?Clonazepam caused depression. Had vacation [place]. ?Surgery 3/27- Dr Teoh/ ENT- septoplasty, inferior turbinate resection ?-----Patient is still not sure if she got the right mask when she met with Lynnae Sandhoff and would like to see if someone can look at it. Would like to talk about Klonopin ?Septoplasty/Dr Teoh-let her breathe more easily through her nose.  She had significant bleeding. ?Reviewed.  Using CPAP well with good control and minimal leak.  Unfortunately it has not changed her incidence of headache or her sense of daytime tiredness.  She thinks the daytime tiredness is laded to daytime medications especially beta-blocker and possibly Topamax.  She will discuss these with her neurologist.  She does not feel like she can take naps effectively, but that may be a useful direction.  I am reluctant to suggest a stimulant.  We discussed alternatives to CPAP-fitted oral appliance or Inspire.  In either case, since we know that CPAP is functioning very well based on download, it is hard to expect that another modality would be better at addressing her goals.  She will work on  mask fit and humidifier settings for comfort.  I have given her permission to stop CPAP if she decides it really is not helpful.  She does think clonazepam helps, using 0.5-0.75 mg.  We will refill prescription and she can adjust dosage. ? ? ?ROS-see HPI  + = positive ?Constitutional:    weight loss, night sweats, fevers, chills, +fatigue, lassitude. ?HEENT:    +headaches, difficulty  swallowing, tooth/dental problems, sore throat,  ?     sneezing, itching, ear ache, nasal congestion, post nasal drip, snoring ?CV:    chest pain, orthopnea, PND, swelling in lower extremities, anasarca,                                   ?dizziness, palpitations ?Resp:   shortness of breath with exertion or at rest.   ?             productive cough,   non-productive cough, coughing up of blood.   ?           change in color of mucus.  wheezing.   ?Skin:    rash or lesions. ?GI:  No-   heartburn, indigestion, abdominal pain, nausea, vomiting, diarrhea,  ?               change in bowel habits, loss of appetite ?GU: dysuria, change in color of urine, no urgency or frequency.   flank pain. ?MS:   joint pain, stiffness, decreased range of motion, back pain. ?Neuro-     nothing unusual ?Psych:  change in mood or affect.  depression or anxiety.   memory loss. ? ?OBJ- Physical Exam ?General- Alert, Oriented, Affect appropriate, Distress- none acute ?Skin- rash-none, lesions- none, excoriation- none ?Lymphadenopathy- none ?Head- atraumatic ?           Eyes- Gross vision intact, PERRLA, conjunctivae and secretions clear ?           Ears- Hearing, canals-normal ?           Nose- Clear, no-Septal dev, mucus, polyps, erosion, perforation  ?           Throat- Mallampati II thin, posteriorly [laced soft palate, mucosa clear , drainage- none, tonsils- atrophic ?Neck- flexible , trachea midline, no stridor , thyroid nl, carotid no bruit ?Chest - symmetrical excursion , unlabored ?          Heart/CV- RRR , no murmur , no gallop  , no rub, nl s1 s2 ?                          - JVD- none , edema- none, stasis changes- none, varices- none ?          Lung- clear to P&A, wheeze- none, cough- none , dullness-none, rub- none ?          Chest wall-  ?Abd-  ?Br/ Gen/ Rectal- Not done, not indicated ?Extrem- cyanosis- none, clubbing, none, atrophy- none, strength- nl ?Neuro- grossly intact to observation ? ?

## 2022-05-14 ENCOUNTER — Encounter: Payer: Self-pay | Admitting: Internal Medicine

## 2022-05-14 ENCOUNTER — Ambulatory Visit (INDEPENDENT_AMBULATORY_CARE_PROVIDER_SITE_OTHER): Payer: Medicare Other | Admitting: Internal Medicine

## 2022-05-14 DIAGNOSIS — G4733 Obstructive sleep apnea (adult) (pediatric): Secondary | ICD-10-CM

## 2022-05-14 DIAGNOSIS — Z9989 Dependence on other enabling machines and devices: Secondary | ICD-10-CM | POA: Diagnosis not present

## 2022-05-14 DIAGNOSIS — G43709 Chronic migraine without aura, not intractable, without status migrainosus: Secondary | ICD-10-CM | POA: Diagnosis not present

## 2022-05-14 MED ORDER — CLONAZEPAM 0.5 MG PO TABS
ORAL_TABLET | ORAL | 1 refills | Status: DC
Start: 2022-05-14 — End: 2022-10-07

## 2022-05-14 NOTE — Patient Instructions (Signed)
Ok for you to explore whether you want to stay on CPAP. ? ?Ok to continue clonazepam ? ?Please call if we can help ?

## 2022-05-14 NOTE — Assessment & Plan Note (Signed)
Moderate OSA.  Using CPAP well with good control and compliance.  It has not helped her complaints of headache or daytime fatigue. ?Plan-we discussed comfort issues and alternatives to CPAP.  She will decide whether to continue. ?

## 2022-05-14 NOTE — Assessment & Plan Note (Signed)
She has had septoplasty and she has given CPAP with good trial with good download response but no definite impact on headaches yet. ?

## 2022-05-15 ENCOUNTER — Other Ambulatory Visit: Payer: Self-pay | Admitting: *Deleted

## 2022-05-15 ENCOUNTER — Telehealth: Payer: Self-pay | Admitting: *Deleted

## 2022-05-15 MED ORDER — PROPRANOLOL HCL 20 MG PO TABS: 20.0000 mg | ORAL_TABLET | Freq: Two times a day (BID) | ORAL | 0 refills | Status: DC

## 2022-05-15 NOTE — Telephone Encounter (Signed)
I called the patient to be sure we are sending the correct prescription to the correct pharmacy. She is requesting propranolol '20mg'$ , one tab BID be sent to [facility] for a 90-day supply. Says she has an appt w/ Dr. Jaynee Eagles on 05/20/22 and would like to wait on the other medications (in case of any tx changes). ?

## 2022-05-15 NOTE — Telephone Encounter (Signed)
Called and spoke with patient who states that when [facility] today she was told her RX was being process but what they mentioned on the phone did not match what Dr. Annamaria Boots sent in yesterday. [facility] and they were able to verify the RX that was sent in yesterday by Dr. Annamaria Boots. Patient has been notified. Nothing further needed at this time. ?

## 2022-05-16 DIAGNOSIS — Z6829 Body mass index (BMI) 29.0-29.9, adult: Secondary | ICD-10-CM | POA: Diagnosis not present

## 2022-05-16 DIAGNOSIS — N952 Postmenopausal atrophic vaginitis: Secondary | ICD-10-CM | POA: Diagnosis not present

## 2022-05-16 DIAGNOSIS — Z01419 Encounter for gynecological examination (general) (routine) without abnormal findings: Secondary | ICD-10-CM | POA: Diagnosis not present

## 2022-05-16 DIAGNOSIS — Z1231 Encounter for screening mammogram for malignant neoplasm of breast: Secondary | ICD-10-CM | POA: Diagnosis not present

## 2022-05-16 MED ORDER — PROPRANOLOL HCL 20 MG PO TABS: 20.0000 mg | ORAL_TABLET | Freq: Two times a day (BID) | ORAL | 1 refills | Status: DC

## 2022-05-16 MED ORDER — TOPIRAMATE 25 MG PO TABS: ORAL_TABLET | ORAL | 1 refills | Status: DC

## 2022-05-20 ENCOUNTER — Ambulatory Visit (AMBULATORY_SURGERY_CENTER): Payer: Self-pay | Admitting: *Deleted

## 2022-05-20 ENCOUNTER — Encounter: Payer: Self-pay | Admitting: Neurology

## 2022-05-20 ENCOUNTER — Ambulatory Visit (INDEPENDENT_AMBULATORY_CARE_PROVIDER_SITE_OTHER): Payer: Medicare Other | Admitting: Neurology

## 2022-05-20 VITALS — BP 158/88 | HR 66 | Ht 66.0 in | Wt 176.0 lb

## 2022-05-20 VITALS — Ht 66.5 in | Wt 169.0 lb

## 2022-05-20 DIAGNOSIS — G43709 Chronic migraine without aura, not intractable, without status migrainosus: Secondary | ICD-10-CM | POA: Diagnosis not present

## 2022-05-20 DIAGNOSIS — Z86010 Personal history of colonic polyps: Secondary | ICD-10-CM

## 2022-05-20 NOTE — Patient Instructions (Addendum)
Continue Ajovy for at least 2 more injections, aimovig contraindicated due to constipation, Next can try Emgality (initially insurance approved Ajovy but now that this has tried could go back to [facility]) or try Qulipta, Botox. Or send to an academic center.   Can decrease propranolol to once daily for several weeks then stop  Then can stop the Topiramate early June 2 weeks before colonoscopy

## 2022-05-20 NOTE — Progress Notes (Signed)
No egg or soy allergy known to patient  No issues known to pt with past sedation with any surgeries or procedures Patient denies ever being told they had issues or difficulty with intubation  No FH of Malignant Hyperthermia Pt is not on diet pills Pt is not on  home 02  Pt is not on blood thinners  Pt denies issues with constipation  No A fib or A flutter  Pt is on Topamax- had a bleeding episode after septoplasty- per Neurologist today 05-20-22, Neuro told pt to dc Topamax 2 weeks prior to colon starting 06-10-22   PV completed over the phone. Pt verified name, DOB, address and insurance during PV today.   Pt encouraged to call with questions or issues.after PV

## 2022-05-20 NOTE — Progress Notes (Unsigned)
[facility]    Provider:  Dr Jaynee Eagles Requesting Provider: [name], MD Primary Care Provider:  [name], MD CC:  headache  05/20/2022: Patient saw Butler Denmark last and was started on Ajovy, she was started on Topamax and Ajovy, she also was diagnosed with sleep apnea by Dr. Brett Fairy and she has been compliant.  Today were seeing her after starting Ajovy. She just started Ajovy, she started it a little over a month ago, I would continue to Ajovy at this time it is too soon, she is still on propranolol and the topamax, may be seeing some improvement. Has the second shot on 17th. Still having 18 migraine days a month moderate to severe almost every day. Cpap is not helping but still using it, not sleeping any better at all. Taking propranolol 20mg  bid. She was on the long acting but it was making her feel exhausted. Not helping and making her feel terrible. Could not tolerate increased Topiramate.   Patient complains of symptoms per HPI as well as the following symptoms: septoplasty . Pertinent negatives and positives per HPI. All others negative   Interval headaches 01/16/2022: She is working with Dr. Brett Fairy on her sleep.She is compliant with the cpap. Headaches and migranies are worse nw and more severe. She is having 12 migraines a month and at least 15 headaches days, we spoke about lifestyle, journaling, her deviated septum, her MRI results, her sleep apnea, we spoke about prevention options, likely migraines.  Answered all questions.  MRI brain (with and without) demonstrating: - Mild periventricular and subcortical juxtacortical and pontine foci of nonspecific T2 hyperintensities.  No abnormal lesions are seen on post contrast views.  Considerations include autoimmune, inflammatory, post-infectious, microvascular ischemic or migraine associated etiologies.  - No acute findings.  MRI cervical spine: Unremarkable  Patient complains of symptoms per HPI as well  as the following symptoms: deviated septum . Pertinent negatives and positives per HPI. All others negative   HPI:  Misty [name] is a 71 y.o. female here as requested by [name], MD for headaches. Started a year ago, she used to have migraines when she was younger. Last summer she was on effexor and she had tapered it in July of last year she started not being able to sleep she saw her doctor in November, she started taking xanax at bedtime with trazodone, this is in 2021. In January in 2022 she started feeling a little blue, she tried Lexapro and she woke up with a blinding headache that lasted for days. She tried Zoloft after that and she stopped xanax. She then changed to Buspar May 2022 and the headaches still persisted but not as bad as below, always similar to prior migraines, a little sound and light sensitivity, throbbing always in the forehead, no nausea. She has headaches 3x a week and can wake with them. She does not feel refreshed, wakes with headaches. She fell 4 years ago on her nose. Se has had some vision changes and she has gone through a year of changing glasses it was strange, decreased vision in the right eye.   2-3x a week, at least 1x a week she wakes up with them, still waking frequently at night, she sleeps in the guest bedroom, toss and turn all night, usually 1/2 excedrin migraine will stop it, some light sound and sound sensitivity, she has cut off caffeine, no increased alcohol. No known triggers. She doesn't feel rested in the morning and they can also start  late morning as well, mild to moderate in pain. No other focal neurologic deficits, associated symptoms, inciting events or modifiable factors.  Reviewed notes, labs and imaging from outside physicians, which showed:  All CT scans at [facility] and [facility] are performed using dose optimization techniques as appropriate to a performed exam, including but not limited to  one or more of the following: automated exposure control, adjustment of the mA and/or kV according to patient size, use of iterative reconstruction technique. In addition, [facility] is participating in the [place] program which will further assist [place] in optimizing patient radiation exposure.    CT head 08/08/2017:   .  Calvarium/skull base: No evidence of acute fracture or destructive lesion. No substantial disease in the mastoids or middle ears.  .  Paranasal sinuses: Small amount of fluid right maxillary sinus.  .  Brain: No evidence of acute large vascular territory infarct. No mass effect, mass lesion, or hydrocephalus. No acute hemorrhage. Bilateral nasal bone fractures. Slight displacement to the left  Review of Systems: Patient complains of symptoms per HPI as well as the following symptoms morning headache. Pertinent negatives and positives per HPI. All others negative.   Social History   Socioeconomic History   Marital status: Married    Spouse name: Not on file   Number of children: 0   Years of education: Not on file   Highest education level: Not on file  Occupational History   Occupation: non Airline pilot  Tobacco Use   Smoking status: Never   Smokeless tobacco: Never  Vaping Use   Vaping Use: Never used  Substance and Sexual Activity   Alcohol use: Yes    Comment: occ   Drug use: No   Sexual activity: Yes    Partners: Male  Other Topics Concern   Not on file  Social History Narrative   Not on file   Social Determinants of Health   Financial Resource Strain: Not on file  Food Insecurity: Not on file  Transportation Needs: Not on file  Physical Activity: Not on file  Stress: Not on file  Social Connections: Not on file  Intimate Partner Violence: Not on file    Family History  Problem Relation Age of Onset   Dementia Mother    Hypertension Mother    Glaucoma Mother    Heart disease Father    Rheum arthritis Father    Lupus Father     Aneurysm Father    CAD Father    Hypertension Father    Hyperlipidemia Father    Stroke Maternal Grandfather    Breast cancer Neg Hx    Colon cancer Neg Hx    Stomach cancer Neg Hx    Esophageal cancer Neg Hx    Rectal cancer Neg Hx    Migraines Neg Hx     Past Medical History:  Diagnosis Date   Allergy    Anxiety    Asthma    Depression    GERD (gastroesophageal reflux disease)    GERD (gastroesophageal reflux disease)    Headache    Hiatal hernia    Hypercholesteremia    Insomnia    Osteopenia    Plantar fascia syndrome    Scoliosis    chronic pain   Sleep apnea     Patient Active Problem List   Diagnosis Date Noted   Insomnia    OSA on CPAP 02/18/2022   Chronic migraine without aura without  status migrainosus, not intractable 01/16/2022   Witnessed episode of apnea 09/05/2021   Worsening headaches 09/05/2021   Morning headache 09/05/2021   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   Non-restorative sleep 09/05/2021   Plantar fasciitis of left foot 03/24/2018   Asthmatic bronchitis without complication 24/82/5003   Seasonal and perennial allergic rhinitis 11/13/2014    Past Surgical History:  Procedure Laterality Date   COLONOSCOPY     excision of morton's neuroma     MYOMECTOMY  1996   NASAL SEPTOPLASTY W/ TURBINOPLASTY N/A 03/25/2022   Procedure: NASAL SEPTOPLASTY WITH TURBINATE REDUCTION;  Surgeon: Leta Baptist, MD;  Location: [place];  Service: ENT;  Laterality: N/A;   TONSILLECTOMY  1966    Current Outpatient Medications  Medication Sig Dispense Refill   Cholecalciferol (VITAMIN D) 125 MCG (5000 UT) CAPS Take 5,000 Units by mouth every other day.     clonazePAM (KLONOPIN) 0.5 MG tablet 1 to 3 tabs for sleep as needed 150 tablet 1   esomeprazole (NEXIUM) 40 MG capsule Take 1 capsule by mouth daily.     ezetimibe (ZETIA) 10 MG tablet Take 10 mg by mouth daily.     Fremanezumab-vfrm (AJOVY) 225 MG/1.5ML SOAJ Inject 225 mg into the skin  every 30 (thirty) days. 4.5 mL 0   Magnesium Gluconate 550 MG TABS Take by mouth.     Multiple Vitamins-Minerals (MULTIVITAMIN PO) Take 1 tablet by mouth daily.     propranolol (INDERAL) 20 MG tablet Take 1 tablet (20 mg total) by mouth 2 (two) times daily. 180 tablet 1   propranolol (INDERAL) 20 MG tablet Take 1 tablet (20 mg total) by mouth 2 (two) times daily. 180 tablet 0   SUMAtriptan (IMITREX) 50 MG tablet Take 1 tablet (50 mg total) by mouth every 2 (two) hours as needed for migraine. May repeat in 2 hours if headache persists or recurs. 10 tablet 3   topiramate (TOPAMAX) 25 MG tablet Take 1 tablet at bedtime x 3 days, then take 2 tablets at bedtime x 3 days, then take 3 at bedtime (Patient taking differently: daily.) 120 tablet 1   vitamin B-12 (CYANOCOBALAMIN) 500 MCG tablet Take by mouth.     Current Facility-Administered Medications  Medication Dose Route Frequency Provider Last Rate Last Admin   0.9 %  sodium chloride infusion  500 mL Intravenous Once [place], MD        Allergies as of 05/20/2022 - Review Complete 05/20/2022  Allergen Reaction Noted   Dust mite extract  08/14/2021   Latex Hives 08/11/2017   Macrobid  [nitrofurantoin macrocrystal]  06/09/2019   Molds & smuts  08/14/2021    Vitals: BP (!) 158/88   Pulse 66   Ht $R'5\' 6"'MB$  (1.676 m)   Wt 176 lb (79.8 kg)   LMP  (LMP Unknown)   BMI 28.41 kg/m  Last Weight:  Wt Readings from Last 1 Encounters:  05/20/22 176 lb (79.8 kg)   Last Height:   Ht Readings from Last 1 Encounters:  05/20/22 $RemoveB'5\' 6"'EfZmbhLY$  (1.676 m)   Exam: NAD, pleasant                  Speech:    Speech is normal; fluent and spontaneous with normal comprehension.  Cognition:    The patient is oriented to person, place, and time;     recent and remote memory intact;     language fluent;    Cranial Nerves:    The pupils are  equal, round, and reactive to light.Trigeminal sensation is intact and the muscles of mastication are normal. The face is  symmetric. The palate elevates in the midline. Hearing intact. Voice is normal. Shoulder shrug is normal. The tongue has normal motion without fasciculations.   Coordination:  No dysmetria  Motor Observation:    No asymmetry, no atrophy, and no involuntary movements noted. Tone:    Normal muscle tone.     Strength:    Strength is V/V in the upper and lower limbs.      Sensation: intact to LT        Assessment/Plan:  Patient with refractory migraines, still having 18 a month but just started Ajovy a few months ago. I would stay on Ajovy for several months, may take longer, would try emgality or botox next. She had septopasty in March did not help. Cpap not helping. Could not tolerate topiramate or propranolol. Now on Ajovy.  Continue Ajovy, aimovig contraindicated due to constipation, can try Emgality (initially insurance approved Ajovy but now that this has tried could go back to [facility]) or try Qulipta, Botox. Or send to an academic center.   She is working with Dr. Brett Fairy on her sleep.She is compliant with the cpap. Not helping sleep or headache.  Propranolol ER made her feel worse, she is on $Rem'20mg'KdRd$  twice a day, making her feel bad, go down to $Remov'20mg'lgzQJF$  and then stop after a few weeks.  We spoke about lifestyle, journaling, her deviated septum(septoplasty in March), her MRI results(mild small vessel disease), her sleep apnea(not helping), we spoke about prevention options, likely migraines.  Answered all questions.  Topiramate, had some bleeding issues, this can be a rare side effects of topiramate, will not increase. Will stop, she does not want to increase and did not increase as prescribed.  Acute: rizatriptan: Please take one tablet at the onset of your headache. If it does not improve the symptoms please take one additional tablet. Do not take more then 2 tablets in 24hrs. Do not take use more then 2 to 3 times in a week.  Newer meds: Leland Her, Nurtec, Lajean Silvius  Stopping by the office for esr/crp  Letting nurse know patient stopping for bloodwork  Reviewed chart  Discussed: To prevent or relieve headaches, try the following: Cool Compress. Lie down and place a cool compress on your head.  Avoid headache triggers. If certain foods or odors seem to have triggered your migraines in the past, avoid them. A headache diary might help you identify triggers.  Include physical activity in your daily routine. Try a daily walk or other moderate aerobic exercise.  Manage stress. Find healthy ways to cope with the stressors, such as delegating tasks on your to-do list.  Practice relaxation techniques. Try deep breathing, yoga, massage and visualization.  Eat regularly. Eating regularly scheduled meals and maintaining a healthy diet might help prevent headaches. Also, drink plenty of fluids.  Follow a regular sleep schedule. Sleep deprivation might contribute to headaches Consider biofeedback. With this mind-body technique, you learn to control certain bodily functions -- such as muscle tension, heart rate and blood pressure -- to prevent headaches or reduce headache pain.    Proceed to emergency room if you experience new or worsening symptoms or symptoms do not resolve, if you have new neurologic symptoms or if headache is severe, or for any concerning symptom.   Provided education and documentation from American headache Society toolbox including articles on: chronic migraine medication overuse headache, chronic  migraines, prevention of migraines, behavioral and other nonpharmacologic treatments for headache.   No orders of the defined types were placed in this encounter.  No orders of the defined types were placed in this encounter.    Cc: [name], MD,  [name], MD  Sarina Ill, MD  [facility] [address] [place], [place] [address]  Phone [phone] Fax [phone]

## 2022-05-21 ENCOUNTER — Ambulatory Visit: Payer: Medicare Other | Admitting: Internal Medicine

## 2022-06-13 DIAGNOSIS — D485 Neoplasm of uncertain behavior of skin: Secondary | ICD-10-CM | POA: Diagnosis not present

## 2022-06-13 DIAGNOSIS — Z85828 Personal history of other malignant neoplasm of skin: Secondary | ICD-10-CM | POA: Diagnosis not present

## 2022-06-13 DIAGNOSIS — C44729 Squamous cell carcinoma of skin of left lower limb, including hip: Secondary | ICD-10-CM | POA: Diagnosis not present

## 2022-06-19 ENCOUNTER — Encounter: Payer: Self-pay | Admitting: Neurology

## 2022-06-19 ENCOUNTER — Ambulatory Visit (INDEPENDENT_AMBULATORY_CARE_PROVIDER_SITE_OTHER): Payer: Medicare Other | Admitting: Neurology

## 2022-06-19 VITALS — BP 160/91 | HR 62 | Ht 66.0 in | Wt 178.0 lb

## 2022-06-19 DIAGNOSIS — Z9989 Dependence on other enabling machines and devices: Secondary | ICD-10-CM | POA: Diagnosis not present

## 2022-06-19 DIAGNOSIS — G478 Other sleep disorders: Secondary | ICD-10-CM | POA: Diagnosis not present

## 2022-06-19 DIAGNOSIS — F5101 Primary insomnia: Secondary | ICD-10-CM | POA: Diagnosis not present

## 2022-06-19 DIAGNOSIS — G4733 Obstructive sleep apnea (adult) (pediatric): Secondary | ICD-10-CM

## 2022-06-19 NOTE — Patient Instructions (Signed)
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
## 2022-06-25 ENCOUNTER — Encounter: Payer: Self-pay | Admitting: Internal Medicine

## 2022-06-25 ENCOUNTER — Ambulatory Visit (AMBULATORY_SURGERY_CENTER): Payer: Medicare Other | Admitting: Internal Medicine

## 2022-06-25 VITALS — BP 140/63 | HR 61 | Temp 97.1°F | Resp 14 | Ht 66.5 in | Wt 169.0 lb

## 2022-06-25 DIAGNOSIS — Z86010 Personal history of colonic polyps: Secondary | ICD-10-CM | POA: Diagnosis not present

## 2022-06-25 DIAGNOSIS — Z09 Encounter for follow-up examination after completed treatment for conditions other than malignant neoplasm: Secondary | ICD-10-CM

## 2022-06-25 MED ORDER — SODIUM CHLORIDE 0.9 % IV SOLN: 500.0000 mL | Freq: Once | INTRAVENOUS | Status: DC

## 2022-06-25 NOTE — Progress Notes (Signed)
Vitals-CW  Pt's states no medical or surgical changes since previsit or office visit. 

## 2022-06-26 ENCOUNTER — Telehealth: Payer: Self-pay | Admitting: *Deleted

## 2022-06-26 NOTE — Telephone Encounter (Signed)
Left message on f/u call 

## 2022-07-03 DIAGNOSIS — E538 Deficiency of other specified B group vitamins: Secondary | ICD-10-CM | POA: Diagnosis not present

## 2022-07-03 DIAGNOSIS — R5383 Other fatigue: Secondary | ICD-10-CM | POA: Diagnosis not present

## 2022-07-03 DIAGNOSIS — R6889 Other general symptoms and signs: Secondary | ICD-10-CM | POA: Diagnosis not present

## 2022-07-03 DIAGNOSIS — G43719 Chronic migraine without aura, intractable, without status migrainosus: Secondary | ICD-10-CM | POA: Diagnosis not present

## 2022-07-03 DIAGNOSIS — Z79899 Other long term (current) drug therapy: Secondary | ICD-10-CM | POA: Diagnosis not present

## 2022-07-03 DIAGNOSIS — R030 Elevated blood-pressure reading, without diagnosis of hypertension: Secondary | ICD-10-CM | POA: Diagnosis not present

## 2022-07-15 DIAGNOSIS — R030 Elevated blood-pressure reading, without diagnosis of hypertension: Secondary | ICD-10-CM | POA: Diagnosis not present

## 2022-07-15 DIAGNOSIS — G4700 Insomnia, unspecified: Secondary | ICD-10-CM | POA: Diagnosis not present

## 2022-07-15 DIAGNOSIS — G43719 Chronic migraine without aura, intractable, without status migrainosus: Secondary | ICD-10-CM | POA: Diagnosis not present

## 2022-07-22 ENCOUNTER — Ambulatory Visit: Payer: Medicare Other | Admitting: Neurology

## 2022-07-26 DIAGNOSIS — R7989 Other specified abnormal findings of blood chemistry: Secondary | ICD-10-CM | POA: Diagnosis not present

## 2022-07-26 DIAGNOSIS — E559 Vitamin D deficiency, unspecified: Secondary | ICD-10-CM | POA: Diagnosis not present

## 2022-08-01 ENCOUNTER — Other Ambulatory Visit: Payer: Self-pay | Admitting: Adult Health

## 2022-08-01 ENCOUNTER — Ambulatory Visit
Admission: RE | Admit: 2022-08-01 | Discharge: 2022-08-01 | Disposition: A | Discharge status: Home / Self Care | Payer: Medicare Other | Source: Ambulatory Visit | Attending: Adult Health | Admitting: Adult Health

## 2022-08-01 DIAGNOSIS — N2889 Other specified disorders of kidney and ureter: Secondary | ICD-10-CM | POA: Diagnosis not present

## 2022-08-01 DIAGNOSIS — R079 Chest pain, unspecified: Secondary | ICD-10-CM | POA: Diagnosis not present

## 2022-08-01 DIAGNOSIS — I700 Atherosclerosis of aorta: Secondary | ICD-10-CM | POA: Diagnosis not present

## 2022-08-01 DIAGNOSIS — E785 Hyperlipidemia, unspecified: Secondary | ICD-10-CM | POA: Diagnosis not present

## 2022-08-01 DIAGNOSIS — R519 Headache, unspecified: Secondary | ICD-10-CM | POA: Diagnosis not present

## 2022-08-01 DIAGNOSIS — R1084 Generalized abdominal pain: Secondary | ICD-10-CM

## 2022-08-01 DIAGNOSIS — K219 Gastro-esophageal reflux disease without esophagitis: Secondary | ICD-10-CM | POA: Diagnosis not present

## 2022-08-01 DIAGNOSIS — G4700 Insomnia, unspecified: Secondary | ICD-10-CM | POA: Diagnosis not present

## 2022-08-01 DIAGNOSIS — N261 Atrophy of kidney (terminal): Secondary | ICD-10-CM | POA: Diagnosis not present

## 2022-08-01 DIAGNOSIS — G4733 Obstructive sleep apnea (adult) (pediatric): Secondary | ICD-10-CM | POA: Diagnosis not present

## 2022-08-01 DIAGNOSIS — M4186 Other forms of scoliosis, lumbar region: Secondary | ICD-10-CM | POA: Diagnosis not present

## 2022-08-01 DIAGNOSIS — R197 Diarrhea, unspecified: Secondary | ICD-10-CM | POA: Diagnosis not present

## 2022-08-01 DIAGNOSIS — R1013 Epigastric pain: Secondary | ICD-10-CM | POA: Diagnosis not present

## 2022-08-01 DIAGNOSIS — J4520 Mild intermittent asthma, uncomplicated: Secondary | ICD-10-CM | POA: Diagnosis not present

## 2022-08-01 MED ORDER — IOPAMIDOL (ISOVUE-300) INJECTION 61%
100.0000 mL | Freq: Once | INTRAVENOUS | Status: AC | PRN
  Administered 2022-08-01: 100 mL via INTRAVENOUS

## 2022-08-02 DIAGNOSIS — G4701 Insomnia due to medical condition: Secondary | ICD-10-CM | POA: Diagnosis not present

## 2022-08-02 DIAGNOSIS — R030 Elevated blood-pressure reading, without diagnosis of hypertension: Secondary | ICD-10-CM | POA: Diagnosis not present

## 2022-08-02 DIAGNOSIS — G43719 Chronic migraine without aura, intractable, without status migrainosus: Secondary | ICD-10-CM | POA: Diagnosis not present

## 2022-08-02 DIAGNOSIS — E559 Vitamin D deficiency, unspecified: Secondary | ICD-10-CM | POA: Diagnosis not present

## 2022-08-06 DIAGNOSIS — G4701 Insomnia due to medical condition: Secondary | ICD-10-CM | POA: Diagnosis not present

## 2022-08-06 DIAGNOSIS — G43719 Chronic migraine without aura, intractable, without status migrainosus: Secondary | ICD-10-CM | POA: Diagnosis not present

## 2022-08-06 DIAGNOSIS — E559 Vitamin D deficiency, unspecified: Secondary | ICD-10-CM | POA: Diagnosis not present

## 2022-08-06 DIAGNOSIS — R030 Elevated blood-pressure reading, without diagnosis of hypertension: Secondary | ICD-10-CM | POA: Diagnosis not present

## 2022-09-03 ENCOUNTER — Telehealth: Payer: Self-pay | Admitting: Neurology

## 2022-09-03 NOTE — Telephone Encounter (Signed)
Patient is waiting for CPAP Supplies from [employer]> she said it's been 3 weeks and she keeps getting a message stating they are waiting for authorization from the Dr. Brett Fairy. Can you advise if something needs to be sent over or if you received notification from them? I didn't see anything in the chart. Thank you

## 2022-09-03 NOTE — Telephone Encounter (Signed)
I have went in and signed off [employer] [facility] which is where the company sends the order. I will forward this information to [employer].

## 2022-09-09 ENCOUNTER — Ambulatory Visit: Payer: Medicare Other | Admitting: Neurology

## 2022-09-11 DIAGNOSIS — R030 Elevated blood-pressure reading, without diagnosis of hypertension: Secondary | ICD-10-CM | POA: Diagnosis not present

## 2022-09-11 DIAGNOSIS — G4701 Insomnia due to medical condition: Secondary | ICD-10-CM | POA: Diagnosis not present

## 2022-09-11 DIAGNOSIS — G43719 Chronic migraine without aura, intractable, without status migrainosus: Secondary | ICD-10-CM | POA: Diagnosis not present

## 2022-09-11 DIAGNOSIS — E559 Vitamin D deficiency, unspecified: Secondary | ICD-10-CM | POA: Diagnosis not present

## 2022-09-13 ENCOUNTER — Encounter: Payer: Self-pay | Admitting: Internal Medicine

## 2022-09-13 ENCOUNTER — Ambulatory Visit (INDEPENDENT_AMBULATORY_CARE_PROVIDER_SITE_OTHER): Payer: Medicare Other | Admitting: Internal Medicine

## 2022-09-13 VITALS — BP 144/72 | HR 85 | Ht 66.5 in | Wt 179.0 lb

## 2022-09-13 DIAGNOSIS — K2100 Gastro-esophageal reflux disease with esophagitis, without bleeding: Secondary | ICD-10-CM | POA: Diagnosis not present

## 2022-09-13 DIAGNOSIS — R0789 Other chest pain: Secondary | ICD-10-CM

## 2022-09-13 DIAGNOSIS — F439 Reaction to severe stress, unspecified: Secondary | ICD-10-CM

## 2022-09-13 NOTE — Patient Instructions (Signed)
Glad your feeling better.  _______________________________________________________  If you are age 69 or older, your body mass index should be between 23-30. Your Body mass index is 28.46 kg/m. If this is out of the aforementioned range listed, please consider follow up with your Primary Care Provider.  If you are age 81 or younger, your body mass index should be between 19-25. Your Body mass index is 28.46 kg/m. If this is out of the aformentioned range listed, please consider follow up with your Primary Care Provider.   ________________________________________________________  The [facility] providers would like to encourage you to use [facility] to communicate with providers for non-urgent requests or questions.  Due to long hold times on the telephone, sending your provider a message by [facility] may be a faster and more efficient way to get a response.  Please allow 48 business hours for a response.  Please remember that this is for non-urgent requests.  _______________________________________________________   I appreciate the opportunity to care for you. Silvano Rusk, MD, [facility]

## 2022-09-13 NOTE — Progress Notes (Signed)
Misty [name] 71 y.o. 11-Aug-1951 [id number]  Assessment & Plan:   Encounter Diagnoses  Name Primary?   Atypical chest pain-resolved Yes   Gastroesophageal reflux disease with esophagitis without hemorrhage    Situational stress-improved     I would not be surprised if stress was not contributing to her problems back in August.  The symptoms are resolved.  She will continue her treatment for GERD as she has done throughout the years and follow-up as needed.  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  Awakened from sleep.  She describes being under a lot of stress due to husband being diagnosed with atrial myxoma at that time.  She went to primary care and was evaluated EKG was without change, she had a CT scan of the abdomen and pelvis because of some right upper quadrant pain that was associated as well.  She could feel a little bit of tenderness when palpating.  After that she was advised to take Nexium twice daily for about 3 weeks she was on a strict reflux diet.  Symptoms resolved.  Husband has had successful atrial myxoma removal at [facility].  The patient herself has suffered with increasing headache syndrome since having COVID vaccines, and is seeing a neurologist in [place] and though is improved still has these migraine-like headaches that are disabling at times and this is a source of stress as well.  She is not having typical GERD symptoms i.e. heartburn etc. like she has had in the past.  EGD in 2006 showed grade B esophagitis and a hiatal hernia.  No procedure since.  There is no dysphagia.   Colonoscopy 06/25/22 - Diverticulosis in the sigmoid colon. - The examination was otherwise normal on direct and retroflexion views. - No specimens collected. - Personal history of colonic polyps. 2  adenomas and ssp 2020 (max 10 mm) - recall 2028  CT abd/pelvis w/ contrast 08/01/22 - acute right abd pain IMPRESSION: No acute abnormality is noted in the abdomen or pelvis.   Moderate levoscoliosis of thoracic and lumbar spine is noted.   Aortic Atherosclerosis (ICD10-I70.0).  Allergies  Allergen Reactions   Dust Mite Extract    Latex Hives   Macrobid  [Nitrofurantoin Macrocrystal]    Molds & Smuts    Current Meds  Medication Sig   albuterol (VENTOLIN HFA) 108 (90 Base) MCG/ACT inhaler INHALE 1 TO 2 PUFFS BY MOUTH EVERY 4 TO 6 HOURS AS NEEDED   Cholecalciferol (VITAMIN D) 125 MCG (5000 UT) CAPS Take 5,000 Units by mouth daily.   clonazePAM (KLONOPIN) 0.5 MG tablet 1 to 3 tabs for sleep as needed (Patient taking differently: Take 1 tablet by mouth daily.)   esomeprazole (NEXIUM) 40 MG capsule Take 1 capsule by mouth daily.   ezetimibe (ZETIA) 10 MG tablet Take 10 mg by mouth daily.   Fremanezumab-vfrm (AJOVY) 225 MG/1.5ML SOAJ Inject 225 mg into the skin every 30 (thirty) days.   hydrocortisone 2.5 % cream Apply topically.   ketoconazole (NIZORAL) 2 % cream SMARTSIG:1 Topical Daily PRN   Magnesium Gluconate 550 MG TABS Take by mouth.   Multiple Vitamins-Minerals (MULTIVITAMIN PO) Take 1 tablet by mouth daily.   mupirocin ointment (BACTROBAN) 2 % Apply topically as directed.   SUMAtriptan (IMITREX) 50 MG tablet Take 1 tablet (50  mg total) by mouth every 2 (two) hours as needed for migraine. May repeat in 2 hours if headache persists or recurs.   verapamil (VERELAN PM) 120 MG 24 hr capsule Take 120 mg by mouth at bedtime.   vitamin B-12 (CYANOCOBALAMIN) 500 MCG tablet Take by mouth.   Past Medical History:  Diagnosis Date   Allergy    Anxiety    Asthma    allergic asthma due to allergies per pt   Depression    GERD (gastroesophageal reflux disease)    Headache    Hiatal hernia    Hypercholesteremia    Insomnia    Osteopenia    Plantar fascia syndrome    Scoliosis     chronic pain   Sleep apnea    wears cpap   Past Surgical History:  Procedure Laterality Date   COLONOSCOPY     excision of morton's neuroma  1986   MYOMECTOMY  1996   NASAL SEPTOPLASTY W/ TURBINOPLASTY N/A 03/25/2022   Procedure: NASAL SEPTOPLASTY WITH TURBINATE REDUCTION;  Surgeon: Leta Baptist, MD;  Location: [facility];  Service: ENT;  Laterality: N/A;   [place] History Narrative   Retired Biomedical scientist, married   No  alcohol or drug use never smoker   family history includes Aneurysm in her father; CAD in her father; Dementia in her mother; Glaucoma in her mother; Heart disease in her father; Hyperlipidemia in her father; Hypertension in her father and mother; Lupus in her father; Rheum arthritis in her father; Stroke in her maternal grandfather.   Review of Systems See HPI  Objective:   Physical Exam '@BP'$  (!) 144/72   Pulse 85   Ht 5' 6.5" (1.689 m)   Wt 179 lb (81.2 kg)   LMP  (LMP Unknown)   BMI 28.46 kg/m @  General:  NAD Eyes:   anicteric Lungs:  clear Heart::  S1S2 no rubs, murmurs or gallops Abdomen:  soft and nontender, BS+     Data Reviewed:  See HPI

## 2022-09-17 ENCOUNTER — Encounter: Payer: Self-pay | Admitting: Internal Medicine

## 2022-09-18 NOTE — Telephone Encounter (Signed)
Mychart message sent by pt: Misty [name] (supporting [name], MD) 22 hours ago (4:47 PM)    Hi Dr Annamaria Boots, I wanted to get your opinion about Clonapin that you prescribed for my insomnia.  Taking .'75mg'$ m hs.  It sorta works.. meaning I sleep 2 hours - occasionally 4-5 - at the time - not great.  Two of my headache doctors aren't fond of it tho because it's a benzo.  Lots of negative publicity about those and L/T effects.  One suggested I try Belsomra, which is $476/month with my Part D plan. I tried to get a trial to test it before a full prescription but not possible.   [employer] did send me paperwork I and my MD can fill out to see if I qualify for free meds. I've read some not-so-good info about clinical trials with it too. What are your thoughts?  When you first prescribed the Clonapin you said there were lots of drugs out now for insomnia but many were expensive. Do you recommend Belsomra or some other one i should try, vs a benzo?  Thank you for your input.    Ps. I tried last week to get an appt with you to discuss and I did / it's not till Oct 2 tho. In the meantime..    Dr. Annamaria Boots, please advise.

## 2022-09-18 NOTE — Telephone Encounter (Signed)
Has she tried [place]? Or Trazodone? We can use a held spot for me to see her if she needs.

## 2022-09-19 ENCOUNTER — Ambulatory Visit: Payer: Medicare Other | Admitting: Internal Medicine

## 2022-09-22 NOTE — Progress Notes (Unsigned)
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 Former patient last seen 2016 w hx  Desiree Lucy,  Allergic Rhinitis, Asthmatic Bronchitis. Self-referred back. Medical problem list includes Migraine, GERD,  Headache,OSA,  CPAPauto 6-16/ [facility]  new Nov  2022 Download-compliance 94.9%, AHI 0.4/ hr Body weight today-175 lbs Clonazepam caused depression. Had vacation [place]. Surgery 3/27- Dr Teoh/ ENT- septoplasty, inferior turbinate resection -----Patient is still not sure if she got the right mask when she met with Lynnae Sandhoff and would like to see if someone can look at it. Would like to talk about Klonopin Septoplasty/Dr Teoh-let her breathe more easily through her nose.  She had significant bleeding. Reviewed.  Using CPAP well with good control and minimal leak.  Unfortunately it has not changed her incidence of headache or her sense of daytime tiredness.  She thinks the daytime tiredness is laded to daytime medications especially beta-blocker and possibly Topamax.  She will discuss these with her neurologist.  She does not feel like she can take naps effectively, but that may be a useful direction.  I am reluctant to suggest a stimulant.  We discussed alternatives to CPAP-fitted oral appliance or Inspire.  In either case, since we know that CPAP is functioning very well based on download, it is hard to expect that another modality would be better at addressing her goals.  She will work on mask fit and humidifier settings for comfort.  I have given her  permission to stop CPAP if she decides it really is not helpful.  She does think clonazepam helps, using 0.5-0.75 mg.  We will refill prescription and she can adjust dosage.  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 Former patient last seen 2016 w hx  Insomnia, Headache,  Allergic Rhinitis, Asthmatic Bronchitis, Migraine, GERD,  Headache  CPAPauto 6-16/ [facility]  new Nov  2022 Download-compliance   AHI 0.2- 0.4   from her phone app Body weight today-.181 lbs Surgery 3/27- Dr Teoh/ ENT- septoplasty, inferior turbinate resection -Clonazepam 0.75> 26 hrs sleep. PCP dislikes "benzos". Belsomra too expensive. Still having headaches dating back about 2 years. Working with Neurology. Now seeing another Neurologist in ?[place] area?[place] Kitchen CPAP may help sleep some but hasn't helped HA. Today her concern is on difficulty initiating and maintaining sleep. Discussed previous meds tried and I encouraged her to gather a list for future use. Trazodone worked well for years, then stopped and failed again on retry. Willing to try an orexin agent like Belsomra or Dayvigo, which are on her approved list per computer, but pharmacy told her Belsomra would cost her over $400/ month. Has not tried doxepin, elavil or lunesta. Again discussed sleep hygiene, side effects.  ROS-see HPI  + = positive Constitutional:    weight loss, night sweats, fevers, chills, +fatigue, lassitude. HEENT:    +headaches, difficulty swallowing, tooth/dental problems, sore throat,       sneezing, itching, ear ache, nasal congestion, post nasal drip, snoring CV:    chest pain, orthopnea, PND, swelling in lower extremities, anasarca,  dizziness, palpitations Resp:   shortness of breath with exertion or at rest.                productive cough,   non-productive cough, coughing up of blood.              change in color of mucus.  wheezing.   Skin:    rash or  lesions. GI:  No-   heartburn, indigestion, abdominal pain, nausea, vomiting, diarrhea,                 change in bowel habits, loss of appetite GU: dysuria, change in color of urine, no urgency or frequency.   flank pain. MS:   joint pain, stiffness, decreased range of motion, back pain. Neuro-     nothing unusual Psych:  change in mood or affect.  depression or anxiety.   memory loss.  OBJ- Physical Exam General- Alert, Oriented, Affect-briefly tearful+, Distress- none acute Skin- rash-none, lesions- none, excoriation- none Lymphadenopathy- none Head- atraumatic            Eyes- Gross vision intact, PERRLA, conjunctivae and secretions clear            Ears- Hearing, canals-normal            Nose- Clear, no-Septal dev, mucus, polyps, erosion, perforation             Throat- Mallampati II thin, posteriorly [laced soft palate, mucosa clear , drainage- none, tonsils- atrophic Neck- flexible , trachea midline, no stridor , thyroid nl, carotid no bruit Chest - symmetrical excursion , unlabored           Heart/CV- RRR , no murmur , no gallop  , no rub, nl s1 s2                           - JVD- none , edema- none, stasis changes- none, varices- none           Lung- clear to P&A, wheeze- none, cough- none , dullness-none, rub- none           Chest wall-  Abd-  Br/ Gen/ Rectal- Not done, not indicated Extrem- cyanosis- none, clubbing, none, atrophy- none, strength- nl Neuro- grossly intact to observation

## 2022-09-23 ENCOUNTER — Encounter: Payer: Self-pay | Admitting: Internal Medicine

## 2022-09-23 ENCOUNTER — Ambulatory Visit (INDEPENDENT_AMBULATORY_CARE_PROVIDER_SITE_OTHER): Payer: Medicare Other | Admitting: Internal Medicine

## 2022-09-23 DIAGNOSIS — F5101 Primary insomnia: Secondary | ICD-10-CM | POA: Diagnosis not present

## 2022-09-23 DIAGNOSIS — Z9989 Dependence on other enabling machines and devices: Secondary | ICD-10-CM

## 2022-09-23 DIAGNOSIS — G4733 Obstructive sleep apnea (adult) (pediatric): Secondary | ICD-10-CM

## 2022-09-23 MED ORDER — ESZOPICLONE 2 MG PO TABS: 2.0000 mg | ORAL_TABLET | Freq: Every evening | ORAL | 0 refills | Status: DC | PRN

## 2022-09-23 NOTE — Patient Instructions (Signed)
Ok to stay with CPAP or try without it.  Script sent to try Lunesta '2mg'$  at bedtime  Alternatives we talked about included doxepin(Sinequan), amitriptyline (Elavil), or the Belsomra. You can see what your neurologist thinks.

## 2022-09-24 ENCOUNTER — Encounter: Payer: Self-pay | Admitting: Internal Medicine

## 2022-09-24 NOTE — Assessment & Plan Note (Addendum)
Compliant with CPAP. It may help sleep some and prevents snoring but hasn't helped headache. Plan- alternatives discussed. Will continue for now.

## 2022-09-24 NOTE — Assessment & Plan Note (Signed)
We discussed alternatives and may press for trial of CBT in future. Plan- try lunesta 2 mg

## 2022-09-25 ENCOUNTER — Other Ambulatory Visit: Payer: Self-pay | Admitting: *Deleted

## 2022-09-25 ENCOUNTER — Encounter: Payer: Self-pay | Admitting: Neurology

## 2022-09-25 MED ORDER — RIZATRIPTAN BENZOATE 10 MG PO TBDP: 10.0000 mg | ORAL_TABLET | ORAL | 3 refills | Status: AC | PRN

## 2022-09-26 NOTE — Telephone Encounter (Signed)
Got the list- very helpful. I look forward to hearing about experience with Lunesta. If it were to work well we could look into [employer], [employer].

## 2022-09-26 NOTE — Telephone Encounter (Signed)
Will forward to Dr. Annamaria Boots to advise regarding pt's Lunesta. Thanks.

## 2022-10-01 ENCOUNTER — Ambulatory Visit: Payer: Medicare Other | Admitting: Internal Medicine

## 2022-10-02 DIAGNOSIS — G4701 Insomnia due to medical condition: Secondary | ICD-10-CM | POA: Diagnosis not present

## 2022-10-02 DIAGNOSIS — R0602 Shortness of breath: Secondary | ICD-10-CM | POA: Diagnosis not present

## 2022-10-02 DIAGNOSIS — R030 Elevated blood-pressure reading, without diagnosis of hypertension: Secondary | ICD-10-CM | POA: Diagnosis not present

## 2022-10-02 DIAGNOSIS — G43719 Chronic migraine without aura, intractable, without status migrainosus: Secondary | ICD-10-CM | POA: Diagnosis not present

## 2022-10-03 ENCOUNTER — Other Ambulatory Visit (HOSPITAL_COMMUNITY): Payer: Self-pay

## 2022-10-03 NOTE — Telephone Encounter (Signed)
Did we do a price check on Belsomra 20 mg ??

## 2022-10-03 NOTE — Telephone Encounter (Signed)
Routing to prior auth team about Belsomra '20mg'$ .

## 2022-10-03 NOTE — Telephone Encounter (Signed)
Medication does not require a prior authorization at this time. Patient has a [phone] deductible so co-pay will be high until met. Currently showing $467.22 until then and an estimated $31.28 after

## 2022-10-03 NOTE — Telephone Encounter (Signed)
New mychart message sent by pt: Misty [name] (supporting [name], MD) 19 hours ago (2:15 PM)    Report on Lunesta '2mg'$ m  - Day 5 - first 2 nights slept in 2 hour increments all night - 3rd night slept 5 hours w/o waking, then a little more in short increments  - 4th night slept 2 and then 3 hours, shorts periods after 5am (pretty much true every day) - 5th night slept 2 hours increments (2, 4, 6 am awakenings), 7:30, 8am   Pretty much like on Trazadone and probably not quite as good as Klonapin.   Oddly I seem to have more headaches the day after I sleep 5+ hours.  Using CPAP all nights.    Recommendations?  Not what we were hoping for. Have 2 more pills in 7 day rx.    Thanks.     Dr. Annamaria Boots, please advise.

## 2022-10-07 DIAGNOSIS — G43719 Chronic migraine without aura, intractable, without status migrainosus: Secondary | ICD-10-CM | POA: Diagnosis not present

## 2022-10-07 DIAGNOSIS — G4701 Insomnia due to medical condition: Secondary | ICD-10-CM | POA: Diagnosis not present

## 2022-10-07 DIAGNOSIS — R0602 Shortness of breath: Secondary | ICD-10-CM | POA: Diagnosis not present

## 2022-10-07 DIAGNOSIS — R030 Elevated blood-pressure reading, without diagnosis of hypertension: Secondary | ICD-10-CM | POA: Diagnosis not present

## 2022-10-07 MED ORDER — CLONAZEPAM 0.5 MG PO TABS: ORAL_TABLET | ORAL | 1 refills | Status: DC

## 2022-10-07 NOTE — Addendum Note (Signed)
Addended by: Baird Lyons D on: 10/07/2022 10:11 AM   Modules accepted: Orders

## 2022-10-07 NOTE — Telephone Encounter (Signed)
Dr. Annamaria Boots, please see recent mychart messages sent by pt after we checked with prior auth team about the belsomra and sent that info to pt for her to review.

## 2022-10-07 NOTE — Telephone Encounter (Signed)
Clonazepam refill sent to [facility] in [place]

## 2022-10-16 DIAGNOSIS — R0602 Shortness of breath: Secondary | ICD-10-CM | POA: Diagnosis not present

## 2022-10-16 DIAGNOSIS — R0609 Other forms of dyspnea: Secondary | ICD-10-CM | POA: Diagnosis not present

## 2022-11-11 DIAGNOSIS — G4701 Insomnia due to medical condition: Secondary | ICD-10-CM | POA: Diagnosis not present

## 2022-11-11 DIAGNOSIS — R0602 Shortness of breath: Secondary | ICD-10-CM | POA: Diagnosis not present

## 2022-11-11 DIAGNOSIS — G43719 Chronic migraine without aura, intractable, without status migrainosus: Secondary | ICD-10-CM | POA: Diagnosis not present

## 2022-11-11 DIAGNOSIS — R030 Elevated blood-pressure reading, without diagnosis of hypertension: Secondary | ICD-10-CM | POA: Diagnosis not present

## 2022-11-19 DIAGNOSIS — J343 Hypertrophy of nasal turbinates: Secondary | ICD-10-CM | POA: Diagnosis not present

## 2022-11-19 DIAGNOSIS — J310 Chronic rhinitis: Secondary | ICD-10-CM | POA: Diagnosis not present

## 2022-11-26 NOTE — Progress Notes (Unsigned)
[facility]    Provider:  Dr Jaynee Eagles Requesting Provider: [name], MD Primary Care Provider:  [name], MD CC:  headache  November 27, 2022: Follow-up for migraines.  Patient with chronic migraines, she has been tried on multiple medications including Ajovy and diagnosed with sleep apnea by Dr. Brett Fairy and other medications to help with headaches which have been little or nothing for her.  At last appointment I wanted her to stay on the Ajovy several more months.  She has tried other medication such as propranolol, she has had MRIs of the brain and C-spine, last appt she was still having 18 migraines a month but she had only just started the Ajovy a month prior and I wanted her to try it for several more months.  Today she is here for follow-up.  She goes to [place] and sees a neurologist there when she is there for 6 months, also a sleep med doctor, this neurologist ordered some other blood work but everything was good except vitamin D was low, she tried Ajovy for 6 months, she tried [employer] and just started on that and she was started on verapamil and nortriptyline. She is feeling much better she only had 2 headaches this month. She also had an echo and everything was normal.   Meds tried that can be used in migraine management include: ajovy, Tylenol, Decadron, Ajovy, nortriptyline, Zofran, propranolol, Maxalt, Imitrex, Topamax, venlafaxine, verapamil, Aimovig contraindicated due to constipation, verapamil, Zoloft, BuSpar, depakote,   Patient complains of symptoms per HPI as well as the following symptoms: migraines . Pertinent negatives and positives per HPI. All others negative   05/20/2022: Patient saw Butler [place] last and was started on Ajovy, she was started on Topamax and Ajovy, she also was diagnosed with sleep apnea by Dr. Brett Fairy and she has been compliant.  Today were seeing her after starting Ajovy. She just started Ajovy, she started it a little  over a month ago, I would continue to Ajovy at this time it is too soon, she is still on propranolol and the topamax, may be seeing some improvement. Has the second shot on 17th. Still having 18 migraine days a month moderate to severe almost every day. Cpap is not helping but still using it, not sleeping any better at all. Taking propranolol '20mg'$  bid. She was on the long acting but it was making her feel exhausted. Not helping and making her feel terrible. Could not tolerate increased Topiramate.   Patient complains of symptoms per HPI as well as the following symptoms: headaches . Pertinent negatives and positives per HPI. All others negative   Interval headaches 01/16/2022: She is working with Dr. Brett Fairy on her sleep.She is compliant with the cpap. Headaches and migranies are worse nw and more severe. She is having 12 migraines a month and at least 15 headaches days, we spoke about lifestyle, journaling, her deviated septum, her MRI results, her sleep apnea, we spoke about prevention options, likely migraines.  Answered all questions.  MRI brain (with and without) demonstrating: - Mild periventricular and subcortical juxtacortical and pontine foci of nonspecific T2 hyperintensities.  No abnormal lesions are seen on post contrast views.  Considerations include autoimmune, inflammatory, post-infectious, microvascular ischemic or migraine associated etiologies.  - No acute findings.  MRI cervical spine: Unremarkable  Patient complains of symptoms per HPI as well as the following symptoms: deviated septum . Pertinent negatives and positives per HPI. All others negative   HPI:  Misty [name]  is a 71 y.o. female here as requested by [name], MD for headaches. Started a year ago, she used to have migraines when she was younger. Last summer she was on effexor and she had tapered it in July of last year she started not being able to sleep she saw her doctor in November, she started taking  xanax at bedtime with trazodone, this is in 2021. In January in 2022 she started feeling a little blue, she tried Lexapro and she woke up with a blinding headache that lasted for days. She tried Zoloft after that and she stopped xanax. She then changed to Buspar May 2022 and the headaches still persisted but not as bad as below, always similar to prior migraines, a little sound and light sensitivity, throbbing always in the forehead, no nausea. She has headaches 3x a week and can wake with them. She does not feel refreshed, wakes with headaches. She fell 4 years ago on her nose. Se has had some vision changes and she has gone through a year of changing glasses it was strange, decreased vision in the right eye.   2-3x a week, at least 1x a week she wakes up with them, still waking frequently at night, she sleeps in the guest bedroom, toss and turn all night, usually 1/2 excedrin migraine will stop it, some light sound and sound sensitivity, she has cut off caffeine, no increased alcohol. No known triggers. She doesn't feel rested in the morning and they can also start late morning as well, mild to moderate in pain. No other focal neurologic deficits, associated symptoms, inciting events or modifiable factors.  Reviewed notes, labs and imaging from outside physicians, which showed:  All CT scans at [facility] and [facility] are performed using dose optimization techniques as appropriate to a performed exam, including but not limited to one or more of the following: automated exposure control, adjustment of the mA and/or kV according to patient size, use of iterative reconstruction technique. In addition, [facility] is participating in the [place] program which will further assist [place] in optimizing patient radiation exposure.    CT head 08/08/2017:   .  Calvarium/skull base: No evidence of acute fracture or destructive lesion. No substantial disease in the mastoids  or middle ears.  .  Paranasal sinuses: Small amount of fluid right maxillary sinus.  .  Brain: No evidence of acute large vascular territory infarct. No mass effect, mass lesion, or hydrocephalus. No acute hemorrhage. Bilateral nasal bone fractures. Slight displacement to the left  Review of Systems: Patient complains of symptoms per HPI as well as the following symptoms morning headache. Pertinent negatives and positives per HPI. All others negative.   Social History   Socioeconomic History   Marital status: Married    Spouse name: Not on file   Number of children: 0   Years of education: Not on file   Highest education level: Not on file  Occupational History   Occupation: non Airline pilot  Tobacco Use   Smoking status: Never   Smokeless tobacco: Never  Vaping Use   Vaping Use: Never used  Substance and Sexual Activity   Alcohol use: Not Currently   Drug use: No   Sexual activity: Yes    Partners: Male  Other Topics Concern   Not on file  Social History Narrative   Retired Biomedical scientist, married   No  alcohol or drug use never smoker   Right Handed  2 cups of Coffee per Day   Social Determinants of Health   Financial Resource Strain: Not on file  Food Insecurity: Not on file  Transportation Needs: Not on file  Physical Activity: Not on file  Stress: Not on file  Social Connections: Not on file  Intimate Partner Violence: Not on file    Family History  Problem Relation Age of Onset   Dementia Mother    Hypertension Mother    Glaucoma Mother    Heart disease Father    Rheum arthritis Father    Lupus Father    Aneurysm Father    CAD Father    Hypertension Father    Hyperlipidemia Father    Stroke Maternal Grandfather    Breast cancer Neg Hx    Colon cancer Neg Hx    Stomach cancer Neg Hx    Esophageal cancer Neg Hx    Rectal cancer Neg Hx    Migraines Neg Hx    Colon polyps Neg Hx     Past Medical History:  Diagnosis Date   Allergy    Anxiety     Asthma    allergic asthma due to allergies per pt   Depression    GERD (gastroesophageal reflux disease)    Headache    Hiatal hernia    Hypercholesteremia    Insomnia    Osteopenia    Plantar fascia syndrome    Scoliosis    chronic pain   Sleep apnea    wears cpap    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   Non-restorative sleep 09/05/2021   Plantar fasciitis of left foot 03/24/2018   Asthmatic bronchitis without complication 40/97/3532   Seasonal and perennial allergic rhinitis 11/13/2014    Past Surgical History:  Procedure Laterality Date   COLONOSCOPY     excision of morton's neuroma  [place]   NASAL SEPTOPLASTY W/ TURBINOPLASTY N/A 03/25/2022   Procedure: NASAL SEPTOPLASTY WITH TURBINATE REDUCTION;  Surgeon: Leta Baptist, MD;  Location: [place];  Service: ENT;  Laterality: N/A;   POLYPECTOMY     SIGMOIDOSCOPY     TONSILLECTOMY  1966    Current Outpatient Medications  Medication Sig Dispense Refill   albuterol (VENTOLIN HFA) 108 (90 Base) MCG/ACT inhaler INHALE 1 TO 2 PUFFS BY MOUTH EVERY 4 TO 6 HOURS AS NEEDED     Cholecalciferol (VITAMIN D) 125 MCG (5000 UT) CAPS Take 5,000 Units by mouth daily.     clonazePAM (KLONOPIN) 0.5 MG tablet 1 to 3 tabs for sleep as needed 150 tablet 1   esomeprazole (NEXIUM) 40 MG capsule Take 1 capsule by mouth daily.     ezetimibe (ZETIA) 10 MG tablet Take 10 mg by mouth daily.     Galcanezumab-gnlm (EMGALITY) 120 MG/ML SOAJ Inject 120 mg into the skin.     Galcanezumab-gnlm (EMGALITY) 120 MG/ML SOAJ Inject 120 mg into the skin every 30 (thirty) days. 2 mL 0   hydrocortisone 2.5 % cream Apply topically.      ketoconazole (NIZORAL) 2 % cream SMARTSIG:1 Topical Daily PRN     Magnesium Gluconate 550 MG TABS Take by mouth.     Multiple Vitamins-Minerals (MULTIVITAMIN  PO) Take 1 tablet by mouth daily.     nortriptyline (PAMELOR) 50 MG capsule Take 50 mg by mouth at bedtime.     rizatriptan (MAXALT-MLT) 10 MG disintegrating tablet Take 1 tablet (10 mg total) by mouth as needed for migraine. May repeat in 2 hours if needed, max 2 tabs / 24 hours 27 tablet 3   verapamil (VERELAN PM) 120 MG 24 hr capsule Take 120 mg by mouth at bedtime.     vitamin B-12 (CYANOCOBALAMIN) 500 MCG tablet Take by mouth.     No current facility-administered medications for this visit.    Allergies as of 11/27/2022 - Review Complete 11/27/2022  Allergen Reaction Noted   Dust mite extract  08/14/2021   Latex Hives 08/11/2017   Macrobid  [nitrofurantoin macrocrystal]  06/09/2019   Molds & smuts  08/14/2021    Vitals: BP (!) 161/99 (BP Location: Right Arm, Patient Position: Sitting, Cuff Size: Normal)   Pulse 86   Ht '5\' 6"'$  (8.466 m)   Wt 185 lb (83.9 kg)   LMP  (LMP Unknown)   BMI 29.86 kg/m  Last Weight:  Wt Readings from Last 1 Encounters:  11/27/22 185 lb (83.9 kg)   Last Height:   Ht Readings from Last 1 Encounters:  11/27/22 '5\' 6"'$  (1.676 m)    Exam: NAD, pleasant                  Speech:    Speech is normal; fluent and spontaneous with normal comprehension.  Cognition:    The patient is oriented to person, place, and time;     recent and remote memory intact;     language fluent;    Cranial Nerves:    The pupils are equal, round, and reactive to light.Trigeminal sensation is intact and the muscles of mastication are normal. The face is symmetric. The palate elevates in the midline. Hearing intact. Voice is normal. Shoulder shrug is normal. The tongue has normal motion without fasciculations.   Coordination:  No dysmetria  Motor Observation:    No asymmetry, no atrophy, and no involuntary movements  noted. Tone:    Normal muscle tone.     Strength:    Strength is V/V in the upper and lower limbs.      Sensation: intact to LT     Assessment/Plan:  Follow-up for migraines.  Patient with chronic migraines, she has been tried on multiple medications including Ajovy and diagnosed with sleep apnea by Dr. Brett Fairy and other medications to help with headaches which have been little or nothing for her.   She has tried multiple medications, she has had MRIs of the brain and C-spine, last appt she was still having 18 migraines a month but she had only just started the Ajovy a month prior and I wanted her to try it for several more months.  Today she is here for follow-up.  Feeling much better, seeing another neurologist.   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 She is feeling much better she only had 2 headaches this month. She also had an echo and everything was normal.   - doing great on verapamil, emgality, nortriptyline  and rizatriptan continue. Continue current management. Try to follow up with Neurologist in [place] but if cannot can schedule an appointment with [place] but discussed seeing two neurologists and being managed by both is not a good idea.  Meds tried that can be used in migraine management include: ajovy, Tylenol, Decadron, Ajovy, nortriptyline, Zofran, propranolol(Propranolol ER made her feel worse, she is on IR '20mg'$  twice a day, making her feel bad, go down to '20mg'$  and then stop after a few weeks.), Maxalt, Imitrex, Topamax(Topiramate, had some bleeding issues, this can be a rare side effects of topiramate. Will stop as above), venlafaxine, verapamil, Aimovig contraindicated due to  constipation, verapamil,Zoloft, BuSpar, depakote,   She is working with Dr. Brett Fairy on her sleep.She is compliant with the cpap. Not helping sleep or headache.  We spoke about lifestyle, journaling, her deviated septum(septoplasty in March di dnot help), her MRI results(mild small vessel disease), her sleep apnea(not helping), we spoke about prevention options, likely migraines.  Answered all questions.  Acute: rizatriptan: Please take one tablet at the onset of your headache. If it does not improve the symptoms please take one additional tablet. Do not take more then 2 tablets in 24hrs. Do not take use more then 2 to 3 times in a week. Works well  Discussed: To prevent or relieve headaches, try the following: Cool Compress. Lie down and place a cool compress on your head.  Avoid headache triggers. If certain foods or odors seem to have triggered your migraines in the past, avoid them. A headache diary might help you identify triggers.  Include physical activity in your daily routine. Try a daily walk or other moderate aerobic exercise.  Manage stress. Find healthy ways to cope with the stressors, such as delegating tasks on your to-do list.  Practice relaxation techniques. Try deep breathing, yoga, massage and visualization.  Eat regularly. Eating regularly scheduled meals and maintaining a healthy diet might help prevent headaches. Also, drink plenty of fluids.  Follow a regular sleep schedule. Sleep deprivation might contribute to headaches Consider biofeedback. With this mind-body technique, you learn to control certain bodily functions -- such as muscle tension, heart rate and blood pressure -- to prevent headaches or reduce headache pain.    Proceed to emergency room if you experience new or worsening symptoms or symptoms do not resolve, if you have new neurologic symptoms or if headache is severe, or for any concerning symptom.   Provided education and documentation from American  headache Society toolbox including articles on: chronic migraine medication overuse headache, chronic migraines, prevention of migraines, behavioral and other nonpharmacologic treatments for headache.   Cc: [name], MD,  [name], MD  Sarina Ill, MD  [facility] [address] [place], [place] [address]  Phone [phone] Fax [phone]  I spent over 30 minutes of face-to-face and non-face-to-face time with patient on the  1. Chronic migraine without aura without status migrainosus, not intractable     diagnosis.  This included previsit chart review, lab review, study review, order entry, electronic health record documentation, patient education on the different diagnostic and therapeutic options, counseling and coordination of care, risks and benefits of management, compliance, or risk factor reduction

## 2022-11-27 ENCOUNTER — Ambulatory Visit (INDEPENDENT_AMBULATORY_CARE_PROVIDER_SITE_OTHER): Payer: Medicare Other | Admitting: Neurology

## 2022-11-27 ENCOUNTER — Encounter: Payer: Self-pay | Admitting: Neurology

## 2022-11-27 VITALS — BP 161/99 | HR 86 | Ht 66.0 in | Wt 185.0 lb

## 2022-11-27 DIAGNOSIS — R7989 Other specified abnormal findings of blood chemistry: Secondary | ICD-10-CM | POA: Diagnosis not present

## 2022-11-27 DIAGNOSIS — F419 Anxiety disorder, unspecified: Secondary | ICD-10-CM | POA: Diagnosis not present

## 2022-11-27 DIAGNOSIS — E559 Vitamin D deficiency, unspecified: Secondary | ICD-10-CM | POA: Diagnosis not present

## 2022-11-27 DIAGNOSIS — G43709 Chronic migraine without aura, not intractable, without status migrainosus: Secondary | ICD-10-CM

## 2022-11-27 DIAGNOSIS — E785 Hyperlipidemia, unspecified: Secondary | ICD-10-CM | POA: Diagnosis not present

## 2022-11-27 MED ORDER — EMGALITY 120 MG/ML ~~LOC~~ SOAJ: 120.0000 mg | SUBCUTANEOUS | 0 refills | Status: DC

## 2022-11-28 DIAGNOSIS — Z85828 Personal history of other malignant neoplasm of skin: Secondary | ICD-10-CM | POA: Diagnosis not present

## 2022-11-28 DIAGNOSIS — L570 Actinic keratosis: Secondary | ICD-10-CM | POA: Diagnosis not present

## 2022-11-28 DIAGNOSIS — L820 Inflamed seborrheic keratosis: Secondary | ICD-10-CM | POA: Diagnosis not present

## 2022-12-04 DIAGNOSIS — N3946 Mixed incontinence: Secondary | ICD-10-CM | POA: Diagnosis not present

## 2022-12-04 DIAGNOSIS — G43909 Migraine, unspecified, not intractable, without status migrainosus: Secondary | ICD-10-CM | POA: Diagnosis not present

## 2022-12-04 DIAGNOSIS — Z1339 Encounter for screening examination for other mental health and behavioral disorders: Secondary | ICD-10-CM | POA: Diagnosis not present

## 2022-12-04 DIAGNOSIS — M7061 Trochanteric bursitis, right hip: Secondary | ICD-10-CM | POA: Diagnosis not present

## 2022-12-04 DIAGNOSIS — Z1331 Encounter for screening for depression: Secondary | ICD-10-CM | POA: Diagnosis not present

## 2022-12-04 DIAGNOSIS — R030 Elevated blood-pressure reading, without diagnosis of hypertension: Secondary | ICD-10-CM | POA: Diagnosis not present

## 2022-12-04 DIAGNOSIS — Z0000 Encounter for general adult medical examination without abnormal findings: Secondary | ICD-10-CM | POA: Diagnosis not present

## 2022-12-04 DIAGNOSIS — M25551 Pain in right hip: Secondary | ICD-10-CM | POA: Diagnosis not present

## 2022-12-04 DIAGNOSIS — G4700 Insomnia, unspecified: Secondary | ICD-10-CM | POA: Diagnosis not present

## 2022-12-04 DIAGNOSIS — R1310 Dysphagia, unspecified: Secondary | ICD-10-CM | POA: Diagnosis not present

## 2022-12-04 DIAGNOSIS — E785 Hyperlipidemia, unspecified: Secondary | ICD-10-CM | POA: Diagnosis not present

## 2022-12-04 DIAGNOSIS — G4733 Obstructive sleep apnea (adult) (pediatric): Secondary | ICD-10-CM | POA: Diagnosis not present

## 2022-12-04 DIAGNOSIS — M5090 Cervical disc disorder, unspecified, unspecified cervical region: Secondary | ICD-10-CM | POA: Diagnosis not present

## 2022-12-04 DIAGNOSIS — F330 Major depressive disorder, recurrent, mild: Secondary | ICD-10-CM | POA: Diagnosis not present

## 2022-12-05 ENCOUNTER — Encounter: Payer: Self-pay | Admitting: Internal Medicine

## 2022-12-05 MED ORDER — CLONAZEPAM 0.5 MG PO TABS: ORAL_TABLET | ORAL | 1 refills | Status: DC

## 2022-12-05 NOTE — Telephone Encounter (Signed)
Clonazepam refilled [facility]

## 2022-12-13 ENCOUNTER — Telehealth: Payer: Self-pay | Admitting: Internal Medicine

## 2022-12-13 NOTE — Telephone Encounter (Signed)
Inbound call from patient wanting to schedule;le for EGD, please advise.  Thank you

## 2022-12-15 ENCOUNTER — Encounter: Payer: Self-pay | Admitting: Internal Medicine

## 2022-12-16 NOTE — Telephone Encounter (Signed)
Pt stated that she sent a my chart message yesterday. Pt was notified that I had forwarded the Message to Dr. Carlean Purl this AM:  Pt stated that she had a swallowing test done in [place] last week: Could not recall exact name of the test: Pt stated that she will get [place] the results as soon as she has them: Pt is wondering if she should have an EGD or Barrium swallow done: Pt was advise that we will await for Dr. Carlean Purl response to her detailed My Chart message that was sent to him with Dr. Carlean Purl  recommendations:  Pt verbalized understanding with all questions answered.

## 2022-12-16 NOTE — Telephone Encounter (Signed)
Left message for pt to call back  °

## 2022-12-17 ENCOUNTER — Other Ambulatory Visit: Payer: Self-pay

## 2022-12-17 DIAGNOSIS — R1310 Dysphagia, unspecified: Secondary | ICD-10-CM

## 2022-12-17 NOTE — Telephone Encounter (Signed)
Pt Husband Shanon Brow made aware of Dr. Carlean Purl recommendations: Pt was scheduled for a Barium Swallow on 12/26/2022 at 10:00 AM at [facility]:Pt to arrive at 9:45 AM, Nothing to eat or drink 3 hours prior starting at 7:00 AM: Pt Husband made aware: Shanon Brow verbalized understanding with all questions answered.

## 2022-12-17 NOTE — Telephone Encounter (Signed)
Please order DG esophagus (barium swallow) with tablet  Dx is dysphagia

## 2022-12-25 DIAGNOSIS — R1314 Dysphagia, pharyngoesophageal phase: Secondary | ICD-10-CM | POA: Diagnosis not present

## 2022-12-25 DIAGNOSIS — G43719 Chronic migraine without aura, intractable, without status migrainosus: Secondary | ICD-10-CM | POA: Diagnosis not present

## 2022-12-25 DIAGNOSIS — G4701 Insomnia due to medical condition: Secondary | ICD-10-CM | POA: Diagnosis not present

## 2022-12-25 DIAGNOSIS — R030 Elevated blood-pressure reading, without diagnosis of hypertension: Secondary | ICD-10-CM | POA: Diagnosis not present

## 2022-12-26 ENCOUNTER — Other Ambulatory Visit (HOSPITAL_COMMUNITY): Payer: Medicare Other

## 2022-12-26 ENCOUNTER — Encounter: Payer: Self-pay | Admitting: Internal Medicine

## 2022-12-26 DIAGNOSIS — G4701 Insomnia due to medical condition: Secondary | ICD-10-CM | POA: Diagnosis not present

## 2022-12-26 DIAGNOSIS — R030 Elevated blood-pressure reading, without diagnosis of hypertension: Secondary | ICD-10-CM | POA: Diagnosis not present

## 2022-12-26 DIAGNOSIS — G43719 Chronic migraine without aura, intractable, without status migrainosus: Secondary | ICD-10-CM | POA: Diagnosis not present

## 2022-12-26 DIAGNOSIS — R1314 Dysphagia, pharyngoesophageal phase: Secondary | ICD-10-CM | POA: Diagnosis not present

## 2023-01-02 ENCOUNTER — Encounter: Payer: Self-pay | Admitting: Internal Medicine

## 2023-01-03 DIAGNOSIS — G4701 Insomnia due to medical condition: Secondary | ICD-10-CM | POA: Diagnosis not present

## 2023-01-03 DIAGNOSIS — R1314 Dysphagia, pharyngoesophageal phase: Secondary | ICD-10-CM | POA: Diagnosis not present

## 2023-01-03 DIAGNOSIS — R030 Elevated blood-pressure reading, without diagnosis of hypertension: Secondary | ICD-10-CM | POA: Diagnosis not present

## 2023-01-03 DIAGNOSIS — G43719 Chronic migraine without aura, intractable, without status migrainosus: Secondary | ICD-10-CM | POA: Diagnosis not present

## 2023-01-06 ENCOUNTER — Other Ambulatory Visit (HOSPITAL_COMMUNITY): Payer: Medicare Other

## 2023-01-07 ENCOUNTER — Other Ambulatory Visit (HOSPITAL_COMMUNITY): Payer: Medicare Other

## 2023-01-13 ENCOUNTER — Ambulatory Visit (HOSPITAL_COMMUNITY)
Admission: RE | Admit: 2023-01-13 | Discharge: 2023-01-13 | Disposition: A | Discharge status: Home / Self Care | Payer: Medicare Other | Source: Ambulatory Visit | Attending: Internal Medicine | Admitting: Internal Medicine

## 2023-01-13 ENCOUNTER — Telehealth: Payer: Self-pay | Admitting: Internal Medicine

## 2023-01-13 ENCOUNTER — Other Ambulatory Visit: Payer: Self-pay

## 2023-01-13 DIAGNOSIS — K449 Diaphragmatic hernia without obstruction or gangrene: Secondary | ICD-10-CM | POA: Diagnosis not present

## 2023-01-13 DIAGNOSIS — K222 Esophageal obstruction: Secondary | ICD-10-CM

## 2023-01-13 DIAGNOSIS — R1310 Dysphagia, unspecified: Secondary | ICD-10-CM

## 2023-01-13 NOTE — Telephone Encounter (Signed)
Pt made aware of Dr. Carlean Purl recommendations: Pt was scheduled for an EGD with dilation on 01/23/2023 at 10:00 AM in the [facility] with DR. Carlean Purl. Pt to arrive at 9:00 AM: Prep instructions were sent to pt via my chart. Pt made aware: Ambulatory referral to GI placed in Epic    Pt verbalized understanding with all questions answered.

## 2023-01-13 NOTE — Telephone Encounter (Signed)
Let her know I saw results and the films  Please schedule EGD w/ dilation in [place] (I think she is ok for [place] - had colonoscopy 6/23 in [place])

## 2023-01-13 NOTE — Telephone Encounter (Signed)
Please see note below. 

## 2023-01-13 NOTE — Telephone Encounter (Signed)
Received a call from the radiologist regarding Barium Swallow done today.  There are some causes for concern for possible esophageal stricture.  Report should be available on EPIC.  Please relay information to Dr. Carlean Purl.  Thank you.

## 2023-01-15 DIAGNOSIS — K080 Exfoliation of teeth due to systemic causes: Secondary | ICD-10-CM | POA: Diagnosis not present

## 2023-01-19 ENCOUNTER — Encounter: Payer: Self-pay | Admitting: Certified Registered Nurse Anesthetist

## 2023-01-23 ENCOUNTER — Ambulatory Visit (AMBULATORY_SURGERY_CENTER): Payer: Medicare Other | Admitting: Internal Medicine

## 2023-01-23 ENCOUNTER — Encounter: Payer: Self-pay | Admitting: Internal Medicine

## 2023-01-23 VITALS — BP 137/41 | HR 74 | Temp 98.0°F | Resp 14 | Ht 66.5 in | Wt 179.0 lb

## 2023-01-23 DIAGNOSIS — K222 Esophageal obstruction: Secondary | ICD-10-CM

## 2023-01-23 DIAGNOSIS — R1310 Dysphagia, unspecified: Secondary | ICD-10-CM

## 2023-01-23 DIAGNOSIS — K317 Polyp of stomach and duodenum: Secondary | ICD-10-CM | POA: Diagnosis not present

## 2023-01-23 MED ORDER — SODIUM CHLORIDE 0.9 % IV SOLN: 500.0000 mL | Freq: Once | INTRAVENOUS | Status: DC

## 2023-01-23 NOTE — Progress Notes (Signed)
Report given to PACU, vss 

## 2023-01-23 NOTE — Patient Instructions (Addendum)
I dilated the stricture which soul improve swallowing difficulty. May see some slight bleeding and have a sore throat but should not persist more than 1-2 days.  There were stomach polyps seen - look like innocent fundic gland polyps. These are often seen with PPI (e.g. Nexium) use and are thought to be harmless.  I will let you know pathology results mail and/or My Chart.  I appreciate the opportunity to care for you. Gatha Mayer, MD, [facility]  Handouts provided on hiatal hernia and post-dilation diet.  Continue present medications.  Await pathology results.   YOU HAD AN ENDOSCOPIC PROCEDURE TODAY AT [facility]:   Refer to the procedure report that was given to you for any specific questions about what was found during the examination.  If the procedure report does not answer your questions, please call your gastroenterologist to clarify.  If you requested that your care partner not be given the details of your procedure findings, then the procedure report has been included in a sealed envelope for you to review at your convenience later.  YOU SHOULD EXPECT: Some feelings of bloating in the abdomen. Passage of more gas than usual.  Walking can help get rid of the air that was put into your GI tract during the procedure and reduce the bloating. If you had a lower endoscopy (such as a colonoscopy or flexible sigmoidoscopy) you may notice spotting of blood in your stool or on the toilet paper. If you underwent a bowel prep for your procedure, you may not have a normal bowel movement for a few days.  Please Note:  You might notice some irritation and congestion in your nose or some drainage.  This is from the oxygen used during your procedure.  There is no need for concern and it should clear up in a day or so.  SYMPTOMS TO REPORT IMMEDIATELY:  Following upper endoscopy (EGD)  Vomiting of blood or coffee ground material  New chest pain or pain under the shoulder blades  Painful  or persistently difficult swallowing  New shortness of breath  Fever of 100F or higher  Black, tarry-looking stools  For urgent or emergent issues, a gastroenterologist can be reached at any hour by calling [phone]. Do not use MyChart messaging for urgent concerns.    DIET:  Clear liquids x1 hour (until 1150am). Then a Soft diet (see handout) for the rest of today. You may resume your regular diet tomorrow. Drink plenty of fluids but you should avoid alcoholic beverages for 24 hours.  ACTIVITY:  You should plan to take it easy for the rest of today and you should NOT DRIVE or use heavy machinery until tomorrow (because of the sedation medicines used during the test).    FOLLOW UP: Our staff will call the number listed on your records the next business day following your procedure.  We will call around 7:15- 8:00 am to check on you and address any questions or concerns that you may have regarding the information given to you following your procedure. If we do not reach you, we will leave a message.     If any biopsies were taken you will be contacted by phone or by letter within the next 1-3 weeks.  Please call us at [phone] if you have not heard about the biopsies in 3 weeks.    SIGNATURES/CONFIDENTIALITY: You and/or your care partner have signed paperwork which will be entered into your electronic medical record.  These signatures  attest to the fact that that the information above on your After Visit Summary has been reviewed and is understood.  Full responsibility of the confidentiality of this discharge information lies with you and/or your care-partner.

## 2023-01-23 NOTE — Progress Notes (Signed)
1035 Robinul 0.1 mg IV given due large amount of secretions upon assessment.  MD made aware, vss 

## 2023-01-23 NOTE — Op Note (Signed)
[facility] Patient Name: Misty [name] Procedure Date: 01/23/2023 10:36 AM MRN: [id number] Endoscopist: Gatha Mayer , MD, [id number] Age: 72 Referring MD:  Date of Birth: [birth year] Gender: Female Account #: [id number] Procedure:                Upper GI endoscopy Indications:              Dysphagia, Suspected stenosis of the esophagus per                            ba swallow, For therapy of esophageal stenosis Medicines:                Monitored Anesthesia Care Procedure:                Pre-Anesthesia Assessment:                           - Prior to the procedure, a History and Physical                            was performed, and patient medications and                            allergies were reviewed. The patient's tolerance of                            previous anesthesia was also reviewed. The risks                            and benefits of the procedure and the sedation                            options and risks were discussed with the patient.                            All questions were answered, and informed consent                            was obtained. Prior Anticoagulants: The patient has                            taken no anticoagulant or antiplatelet agents. ASA                            Grade Assessment: II - A patient with mild systemic                            disease. After reviewing the risks and benefits,                            the patient was deemed in satisfactory condition to                            undergo the procedure.  After obtaining informed consent, the endoscope was                            passed under direct vision. Throughout the                            procedure, the patient's blood pressure, pulse, and                            oxygen saturations were monitored continuously. The                            GIF [id number] #[id number] was introduced through the                            mouth,  and advanced to the second part of duodenum.                            The upper GI endoscopy was accomplished without                            difficulty. The patient tolerated the procedure                            well. Scope In: Scope Out: Findings:                 One benign-appearing, intrinsic moderate                            (circumferential scarring or stenosis; an endoscope                            may pass) stenosis was found at the                            cricopharyngeus. The stenosis was traversed, which                            caused some dilation effect. The scope was                            withdrawn. Dilation was performed with a Maloney                            dilator with mild resistance at 6 Fr. The dilation                            site was examined following endoscope reinsertion                            and showed moderate mucosal disruption. Estimated                            blood loss was minimal.  A small hiatal hernia was present.                           Multiple small semi-sessile polyps were found in                            the gastric fundus and in the gastric body.                            Biopsies were taken with a cold forceps for                            histology. Verification of patient identification                            for the specimen was done. Estimated blood loss was                            minimal.                           The gastroesophageal flap valve was visualized                            endoscopically and classified as Hill Grade III                            (minimal fold, loose to endoscope, hiatal hernia                            likely).                           The exam was otherwise without abnormality.                           The cardia and gastric fundus were otherwise normal                            on retroflexion. Complications:            No  immediate complications. Estimated Blood Loss:     Estimated blood loss was minimal. Impression:               - Benign-appearing esophageal stenosis. Dilated. 62                            Fr                           - Small hiatal hernia.                           - Multiple gastric polyps. Biopsied. look like                            innocent fundic gland polyps                           -  Gastroesophageal flap valve classified as Hill                            Grade III (minimal fold, loose to endoscope, hiatal                            hernia likely).                           - The examination was otherwise normal. Recommendation:           - Patient has a contact number available for                            emergencies. The signs and symptoms of potential                            delayed complications were discussed with the                            patient. Return to normal activities tomorrow.                            Written discharge instructions were provided to the                            patient.                           - Clear liquids x 1 hour then soft foods rest of                            day. Start prior diet tomorrow.                           - Continue present medications.                           - Await pathology results. Gatha Mayer, MD 01/23/2023 10:54:18 AM This report has been signed electronically.

## 2023-01-23 NOTE — Progress Notes (Signed)
[facility] History and Physical   Primary Care Physician:  [name], MD   Reason for Procedure:   Dysphagia w/ stenosis/stricture at UES  Plan:    EGD, esophageal dilation     HPI: Misty [name] is a 72 y.o. female w/ dusphagia - ba swallow recently showed:  01/13/23 IMPRESSION: 1. Prominent cricopharyngeus muscle impression upon the posterior aspect of the lower hypopharynx/upper cervical esophagus. 2. Narrowed appearance of the adjacent upper cervical esophagus. Although a swallowed 13 mm barium tablet was not appreciably delayed at this level, this is concerning for a possible esophageal stricture and endoscopy should be considered for further evaluation. 3. The patient had difficulty initiating the swallow of a 13 mm barium tablet (and the pill was delayed at the level of the vallecula). A modified barium swallow with a speech pathologist is recommended for further evaluation. 4. Small sliding hiatal hernia.  Past Medical History:  Diagnosis Date   Allergy    Anxiety    Asthma    allergic asthma due to allergies per pt   Depression    GERD (gastroesophageal reflux disease)    Headache    Hiatal hernia    Hypercholesteremia    Insomnia    Osteopenia    Plantar fascia syndrome    Scoliosis    chronic pain   Sleep apnea    wears cpap    Past Surgical History:  Procedure Laterality Date   COLONOSCOPY     excision of morton's neuroma  1986   MYOMECTOMY  1996   NASAL SEPTOPLASTY W/ TURBINOPLASTY N/A 03/25/2022   Procedure: NASAL SEPTOPLASTY WITH TURBINATE REDUCTION;  Surgeon: Leta Baptist, MD;  Location: [facility];  Service: ENT;  Laterality: N/A;   Page    Prior to Admission medications   Medication Sig Start Date End Date Taking? Authorizing Provider  Cholecalciferol (VITAMIN D) 125 MCG (5000 UT) CAPS Take 5,000 Units by mouth daily.   Yes [provider]   clonazePAM (KLONOPIN) 0.5 MG tablet 1 to 3 tabs for sleep as needed 12/05/22  Yes Young, Tarri Fuller D, MD  esomeprazole (NEXIUM) 40 MG capsule Take 1 capsule by mouth daily.   Yes [provider]  ezetimibe (ZETIA) 10 MG tablet Take 10 mg by mouth daily.   Yes [provider]  ketoconazole (NIZORAL) 2 % cream SMARTSIG:1 Topical Daily PRN 12/05/21  Yes [provider]  Magnesium Gluconate 550 MG TABS Take by mouth.   Yes [provider]  nortriptyline (PAMELOR) 50 MG capsule Take 50 mg by mouth at bedtime.   Yes [provider]  rizatriptan (MAXALT-MLT) 10 MG disintegrating tablet Take 1 tablet (10 mg total) by mouth as needed for migraine. May repeat in 2 hours if needed, max 2 tabs / 24 hours 09/25/22  Yes Melvenia Beam, MD  verapamil (VERELAN PM) 120 MG 24 hr capsule Take 120 mg by mouth at bedtime.   Yes [provider]  vitamin B-12 (CYANOCOBALAMIN) 500 MCG tablet Take by mouth.   Yes [provider]  albuterol (VENTOLIN HFA) 108 (90 Base) MCG/ACT inhaler INHALE 1 TO 2 PUFFS BY MOUTH EVERY 4 TO 6 HOURS AS NEEDED    [provider]  Galcanezumab-gnlm (EMGALITY) 120 MG/ML SOAJ Inject 120 mg into the skin every 30 (thirty) days. 11/27/22   Melvenia Beam, MD  hydrocortisone 2.5 % cream Apply topically. 12/05/21   [provider]    Current Outpatient Medications  Medication Sig Dispense Refill   Cholecalciferol (VITAMIN D) 125 MCG (5000 UT) CAPS Take 5,000 Units by mouth daily.     clonazePAM (KLONOPIN) 0.5 MG tablet 1 to 3 tabs for sleep as needed 270 tablet 1   esomeprazole (NEXIUM) 40 MG capsule Take 1 capsule by mouth daily.     ezetimibe (ZETIA) 10 MG tablet Take 10 mg by mouth daily.     ketoconazole (NIZORAL) 2 % cream SMARTSIG:1 Topical Daily PRN     Magnesium Gluconate 550 MG TABS Take by mouth.     nortriptyline (PAMELOR) 50 MG capsule Take 50 mg by mouth at bedtime.     rizatriptan (MAXALT-MLT) 10 MG  disintegrating tablet Take 1 tablet (10 mg total) by mouth as needed for migraine. May repeat in 2 hours if needed, max 2 tabs / 24 hours 27 tablet 3   verapamil (VERELAN PM) 120 MG 24 hr capsule Take 120 mg by mouth at bedtime.     vitamin B-12 (CYANOCOBALAMIN) 500 MCG tablet Take by mouth.     albuterol (VENTOLIN HFA) 108 (90 Base) MCG/ACT inhaler INHALE 1 TO 2 PUFFS BY MOUTH EVERY 4 TO 6 HOURS AS NEEDED     Galcanezumab-gnlm (EMGALITY) 120 MG/ML SOAJ Inject 120 mg into the skin every 30 (thirty) days. 2 mL 0   hydrocortisone 2.5 % cream Apply topically.     Current Facility-Administered Medications  Medication Dose Route Frequency Provider Last Rate Last Admin   0.9 %  sodium chloride infusion  500 mL Intravenous Once Gatha Mayer, MD        Allergies as of 01/23/2023 - Review Complete 01/23/2023  Allergen Reaction Noted   Dust mite extract  08/14/2021   Latex Hives 08/11/2017   Macrobid  [nitrofurantoin macrocrystal]  06/09/2019   Molds & smuts  08/14/2021    Family History  Problem Relation Age of Onset   Dementia Mother    Hypertension Mother    Glaucoma Mother    Heart disease Father    Rheum arthritis Father    Lupus Father    Aneurysm Father    CAD Father    Hypertension Father    Hyperlipidemia Father    Stroke Maternal Grandfather    Breast cancer Neg Hx    Colon cancer Neg Hx    Stomach cancer Neg Hx    Esophageal cancer Neg Hx    Rectal cancer Neg Hx    Migraines Neg Hx    Colon polyps Neg Hx     Social History   Socioeconomic History   Marital status: Married    Spouse name: Not on file   Number of children: 0   Years of education: Not on file   Highest education level: Not on file  Occupational History   Occupation: non Airline pilot  Tobacco Use   Smoking status: Never   Smokeless tobacco: Never  Vaping Use   Vaping Use: Never used  Substance and Sexual Activity   Alcohol use: Not Currently   Drug use: No   Sexual activity: Yes     Partners: Male  Other Topics Concern   Not on file  Social History Narrative   Retired Biomedical scientist, married   No  alcohol or drug use never smoker   Right Handed   2 cups of Coffee per Day   Social Determinants of Health   Financial Resource Strain: Not on file  Food Insecurity:  Not on file  Transportation Needs: Not on file  Physical Activity: Not on file  Stress: Not on file  Social Connections: Not on file  Intimate Partner Violence: Not on file    Review of Systems:  All other review of systems negative except as mentioned in the HPI.  Physical Exam: Vital signs BP 133/82   Pulse 83   Temp 98 F (36.7 C) (Skin)   Ht 5' 6.5" (1.689 m)   Wt 179 lb (81.2 kg)   LMP  (LMP Unknown)   SpO2 96%   BMI 28.46 kg/m   General:   Alert,  Well-developed, well-nourished, pleasant and cooperative in NAD Lungs:  Clear throughout to auscultation.   Heart:  Regular rate and rhythm; no murmurs, clicks, rubs,  or gallops. Abdomen:  Soft, nontender and nondistended. Normal bowel sounds.   Neuro/Psych:  Alert and cooperative. Normal mood and affect. A and O x 3   '@Fabian Coca'$  Simonne Maffucci, MD, [facility] [phone] (pager) 01/23/2023 10:30 AM@

## 2023-01-23 NOTE — Progress Notes (Signed)
Pt's states no medical or surgical changes since previsit or office visit. VS assessed by D.T

## 2023-01-23 NOTE — Progress Notes (Signed)
Called to room to assist during endoscopic procedure.  Patient ID and intended procedure confirmed with present staff. Received instructions for my participation in the procedure from the performing physician.  

## 2023-01-24 ENCOUNTER — Telehealth: Payer: Self-pay | Admitting: *Deleted

## 2023-01-24 NOTE — Telephone Encounter (Signed)
  Follow up Call-     01/23/2023    9:38 AM 06/25/2022    7:56 AM 06/25/2022    7:55 AM  Call back number  Post procedure Call Back phone  # [phone]   Permission to leave phone message Yes  Yes     Patient questions:  Do you have a fever, pain , or abdominal swelling? No. Pain Score  0 *  Have you tolerated food without any problems? Yes.    Have you been able to return to your normal activities? Yes.    Do you have any questions about your discharge instructions: Diet   No. Medications  No. Follow up visit  No.  Do you have questions or concerns about your Care? No.  Actions: * If pain score is 4 or above: No action needed, pain <4.

## 2023-01-30 DIAGNOSIS — K080 Exfoliation of teeth due to systemic causes: Secondary | ICD-10-CM | POA: Diagnosis not present

## 2023-02-05 ENCOUNTER — Encounter: Payer: Self-pay | Admitting: Internal Medicine

## 2023-02-05 DIAGNOSIS — G4701 Insomnia due to medical condition: Secondary | ICD-10-CM | POA: Diagnosis not present

## 2023-02-05 DIAGNOSIS — R1314 Dysphagia, pharyngoesophageal phase: Secondary | ICD-10-CM | POA: Diagnosis not present

## 2023-02-05 DIAGNOSIS — G43719 Chronic migraine without aura, intractable, without status migrainosus: Secondary | ICD-10-CM | POA: Diagnosis not present

## 2023-02-05 DIAGNOSIS — R030 Elevated blood-pressure reading, without diagnosis of hypertension: Secondary | ICD-10-CM | POA: Diagnosis not present

## 2023-02-19 DIAGNOSIS — R0981 Nasal congestion: Secondary | ICD-10-CM | POA: Diagnosis not present

## 2023-02-19 DIAGNOSIS — J0100 Acute maxillary sinusitis, unspecified: Secondary | ICD-10-CM | POA: Diagnosis not present

## 2023-03-05 DIAGNOSIS — D225 Melanocytic nevi of trunk: Secondary | ICD-10-CM | POA: Diagnosis not present

## 2023-03-05 DIAGNOSIS — L570 Actinic keratosis: Secondary | ICD-10-CM | POA: Diagnosis not present

## 2023-03-05 DIAGNOSIS — L578 Other skin changes due to chronic exposure to nonionizing radiation: Secondary | ICD-10-CM | POA: Diagnosis not present

## 2023-03-05 DIAGNOSIS — Z85828 Personal history of other malignant neoplasm of skin: Secondary | ICD-10-CM | POA: Diagnosis not present

## 2023-03-05 DIAGNOSIS — G4701 Insomnia due to medical condition: Secondary | ICD-10-CM | POA: Diagnosis not present

## 2023-03-05 DIAGNOSIS — M5451 Vertebrogenic low back pain: Secondary | ICD-10-CM | POA: Diagnosis not present

## 2023-03-05 DIAGNOSIS — D1801 Hemangioma of skin and subcutaneous tissue: Secondary | ICD-10-CM | POA: Diagnosis not present

## 2023-03-05 DIAGNOSIS — R030 Elevated blood-pressure reading, without diagnosis of hypertension: Secondary | ICD-10-CM | POA: Diagnosis not present

## 2023-03-05 DIAGNOSIS — G43719 Chronic migraine without aura, intractable, without status migrainosus: Secondary | ICD-10-CM | POA: Diagnosis not present

## 2023-03-05 DIAGNOSIS — L820 Inflamed seborrheic keratosis: Secondary | ICD-10-CM | POA: Diagnosis not present

## 2023-03-05 DIAGNOSIS — L821 Other seborrheic keratosis: Secondary | ICD-10-CM | POA: Diagnosis not present

## 2023-03-05 DIAGNOSIS — D485 Neoplasm of uncertain behavior of skin: Secondary | ICD-10-CM | POA: Diagnosis not present

## 2023-03-13 DIAGNOSIS — R030 Elevated blood-pressure reading, without diagnosis of hypertension: Secondary | ICD-10-CM | POA: Diagnosis not present

## 2023-03-13 DIAGNOSIS — G43719 Chronic migraine without aura, intractable, without status migrainosus: Secondary | ICD-10-CM | POA: Diagnosis not present

## 2023-03-13 DIAGNOSIS — G4701 Insomnia due to medical condition: Secondary | ICD-10-CM | POA: Diagnosis not present

## 2023-03-13 DIAGNOSIS — M5451 Vertebrogenic low back pain: Secondary | ICD-10-CM | POA: Diagnosis not present

## 2023-03-27 DIAGNOSIS — M5416 Radiculopathy, lumbar region: Secondary | ICD-10-CM | POA: Diagnosis not present

## 2023-04-03 DIAGNOSIS — G43719 Chronic migraine without aura, intractable, without status migrainosus: Secondary | ICD-10-CM | POA: Diagnosis not present

## 2023-04-03 DIAGNOSIS — R030 Elevated blood-pressure reading, without diagnosis of hypertension: Secondary | ICD-10-CM | POA: Diagnosis not present

## 2023-04-03 DIAGNOSIS — M5451 Vertebrogenic low back pain: Secondary | ICD-10-CM | POA: Diagnosis not present

## 2023-04-03 DIAGNOSIS — G4701 Insomnia due to medical condition: Secondary | ICD-10-CM | POA: Diagnosis not present

## 2023-04-09 DIAGNOSIS — M5416 Radiculopathy, lumbar region: Secondary | ICD-10-CM | POA: Diagnosis not present

## 2023-04-10 ENCOUNTER — Encounter: Payer: Self-pay | Admitting: Internal Medicine

## 2023-04-10 ENCOUNTER — Telehealth: Payer: Self-pay

## 2023-04-10 NOTE — Telephone Encounter (Signed)
Kendre's PCP rx'ed her esomeprazole 40mg  and they can't get it approved. She takes 1-2 daily. Since her EGD she can swallow fine however her GERD is bothering her a  little more so that is why she has to takes 2 some days. Alanda is requesting we send it in since it's a GI medicine to see if we can get it covered for BID use. Is this okay to refill under your name [facility]? She request we send it to [facility].

## 2023-04-11 DIAGNOSIS — M5416 Radiculopathy, lumbar region: Secondary | ICD-10-CM | POA: Diagnosis not present

## 2023-04-11 MED ORDER — ESOMEPRAZOLE MAGNESIUM 40 MG PO CPDR: 40.0000 mg | DELAYED_RELEASE_CAPSULE | Freq: Two times a day (BID) | ORAL | 3 refills | Status: DC

## 2023-04-11 NOTE — Telephone Encounter (Signed)
I have written an Rx for bid esomeprazole and sent to [facility]  I think the problem is the quantity limit - looks like they max it at 1/day  We can try this or see if there is an alternative

## 2023-04-11 NOTE — Telephone Encounter (Signed)
I called and left Khristina a message that the [facility] has been [facility] and we will see if it goes thru. If a prior authorization is needed we will get our team to help [place].

## 2023-04-13 ENCOUNTER — Encounter: Payer: Self-pay | Admitting: Internal Medicine

## 2023-04-14 NOTE — Telephone Encounter (Signed)
Patient states the medication has a quantity limit. Patient states this medication will need a appeal per her insurance company. Please start the appeal process Prior auth team. Thanks.

## 2023-04-15 DIAGNOSIS — M5416 Radiculopathy, lumbar region: Secondary | ICD-10-CM | POA: Diagnosis not present

## 2023-04-17 DIAGNOSIS — M5416 Radiculopathy, lumbar region: Secondary | ICD-10-CM | POA: Diagnosis not present

## 2023-04-21 DIAGNOSIS — M5416 Radiculopathy, lumbar region: Secondary | ICD-10-CM | POA: Diagnosis not present

## 2023-04-22 ENCOUNTER — Telehealth: Payer: Self-pay

## 2023-04-22 ENCOUNTER — Other Ambulatory Visit (HOSPITAL_COMMUNITY): Payer: Self-pay

## 2023-04-22 NOTE — Telephone Encounter (Signed)
PA has been submitted for a quantity limit exception and will be updated in additional encounter created.

## 2023-04-22 NOTE — Telephone Encounter (Signed)
Pharmacy Patient Advocate Encounter  Received notification from [employer] that the request for prior authorization for ESOMEPRAZOLE  has been CANCELLED due to PREVIOUS DENIAL ON 3.24.24 .    Please be advised we currently do not have a Pharmacist to review denials, therefore you will need to process appeals accordingly as needed. Thanks for your support at this time.   You may call [phone] or fax [phone], to appeal.  Initial PA was not submitted by PA team. Will need to provide rationale for twice daily treatment.

## 2023-04-22 NOTE — Telephone Encounter (Signed)
PA request received via provider for Esomeprazole Magnesium  dr capsules  *Quantity limit needed for twice daily dosing  PA has been submitted to [facility] Medicare via C[employer] and is pending additional questions/determination  Key: [id number]

## 2023-04-24 NOTE — Telephone Encounter (Signed)
Her BC/BS ID # is [id number].

## 2023-04-24 NOTE — Telephone Encounter (Signed)
I will work on this but likely next week at earliest she shouldbuy OTC and use 2x20 mg = 40 mg for now

## 2023-04-24 NOTE — Telephone Encounter (Signed)
I have started the appeal process over the phone. I will ask Dr Leone Payor to write a letter of medical necessity for BID dosing of her esomeprazole  capsules for her severe GERD K21.9. Then I will fax that to # [phone].

## 2023-04-24 NOTE — Telephone Encounter (Signed)
I called and spoke to [facility] and she still has some of her  esomeprazole left to use until we get this sorted out. She said I have been on this 30 years. She said Thank You for all your help Sir.

## 2023-04-25 DIAGNOSIS — M5416 Radiculopathy, lumbar region: Secondary | ICD-10-CM | POA: Diagnosis not present

## 2023-04-28 NOTE — Telephone Encounter (Signed)
Misty [name] sent a [employer] today stating that BC/BS called her and told her that her generic Nexium has been approved for BID use for a year.

## 2023-04-29 DIAGNOSIS — M5416 Radiculopathy, lumbar region: Secondary | ICD-10-CM | POA: Diagnosis not present

## 2023-05-05 DIAGNOSIS — M5416 Radiculopathy, lumbar region: Secondary | ICD-10-CM | POA: Diagnosis not present

## 2023-05-09 ENCOUNTER — Other Ambulatory Visit (HOSPITAL_COMMUNITY): Payer: Self-pay

## 2023-05-09 NOTE — Telephone Encounter (Signed)
PA has been APPROVED and covered through 04/27/2024

## 2023-05-13 DIAGNOSIS — M5416 Radiculopathy, lumbar region: Secondary | ICD-10-CM | POA: Diagnosis not present

## 2023-05-15 DIAGNOSIS — H52203 Unspecified astigmatism, bilateral: Secondary | ICD-10-CM | POA: Diagnosis not present

## 2023-05-16 DIAGNOSIS — M5416 Radiculopathy, lumbar region: Secondary | ICD-10-CM | POA: Diagnosis not present

## 2023-05-19 ENCOUNTER — Ambulatory Visit: Payer: Medicare Other | Admitting: Internal Medicine

## 2023-05-19 ENCOUNTER — Encounter: Payer: Self-pay | Admitting: Internal Medicine

## 2023-05-19 VITALS — BP 130/78 | HR 84 | Ht 66.0 in | Wt 180.0 lb

## 2023-05-19 DIAGNOSIS — J4520 Mild intermittent asthma, uncomplicated: Secondary | ICD-10-CM | POA: Diagnosis not present

## 2023-05-19 DIAGNOSIS — Z6830 Body mass index (BMI) 30.0-30.9, adult: Secondary | ICD-10-CM | POA: Diagnosis not present

## 2023-05-19 DIAGNOSIS — Z1231 Encounter for screening mammogram for malignant neoplasm of breast: Secondary | ICD-10-CM | POA: Diagnosis not present

## 2023-05-19 DIAGNOSIS — G4733 Obstructive sleep apnea (adult) (pediatric): Secondary | ICD-10-CM

## 2023-05-19 DIAGNOSIS — Z01419 Encounter for gynecological examination (general) (routine) without abnormal findings: Secondary | ICD-10-CM | POA: Diagnosis not present

## 2023-05-19 DIAGNOSIS — Z124 Encounter for screening for malignant neoplasm of cervix: Secondary | ICD-10-CM | POA: Diagnosis not present

## 2023-05-19 MED ORDER — CLONAZEPAM 0.5 MG PO TABS: ORAL_TABLET | ORAL | 1 refills | Status: DC

## 2023-05-19 NOTE — Patient Instructions (Addendum)
Order- [facility]- patient requests change of DME from [facility] please. Continue Luna, autopap 6-16, mask of choice, humidifier, supplies, and please install SD card   Clonazepam refilled at [facility]

## 2023-05-19 NOTE — Progress Notes (Unsigned)
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 Former patient last seen 2016 w hx  Insomnia, Headache,  Allergic Rhinitis, Asthmatic Bronchitis, Migraine, GERD,  Headache  CPAPauto 6-16/ [facility]/ [facility]  new Nov  2022 Download-compliance   AHI 0.2- 0.4   from her phone app Body weight today-.181 lbs Surgery 3/27- Dr Teoh/ ENT- septoplasty, inferior turbinate resection -Clonazepam 0.75> 26 hrs sleep. PCP dislikes "benzos". Belsomra too expensive. Still having headaches dating back about 2 years. Working with Neurology. Now seeing another Neurologist in ?[place] area?Mar[place] Kitchen CPAP may help sleep some but hasn't helped HA. Today her concern is on difficulty initiating and maintaining sleep. Discussed previous meds tried and I encouraged her to gather a list for future use. Trazodone worked well for years, then stopped and failed again on retry. Willing to try an orexin agent like Belsomra or Dayvigo, which are on her approved list per computer, but pharmacy told her Belsomra would cost her over $400/ month. Has not tried doxepin, elavil or lunesta. Again discussed sleep hygiene, side effects.  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  Insomnia, complicated by headache,  Allergic Rhinitis, Asthmatic Bronchitis, Migraine, GERD,  -clonazepam 0.5, ventolin hfa, nortriptyline,  CPAPauto 6-16/ [facility]/ [facility]  new Nov  2022   ([facility]) Download-compliance -used every night, pressure around 6.5, AHI < 1.0/ hr Body weight  today-.180 lbs She reports sleeping much better with CPAP. Clonazepam using 1.25 x 0.5 mg tabs every night at bedtime and this is working well. She has changed from Neurology to [place] for CPAP management. She is frustrated with [facility] DME communication and service and asks change to a different DME. Asthma is mild, intermittent with no recent need for her rescue inhaler.  Fall is usually her worst season.  ROS-see HPI  + = positive Constitutional:    weight loss, night sweats, fevers, chills, +fatigue, lassitude. HEENT:    +headaches, difficulty swallowing, tooth/dental problems, sore throat,       sneezing, itching, ear ache, nasal congestion, post nasal drip, snoring CV:    chest pain, orthopnea, PND, swelling in lower extremities, anasarca,                                   dizziness, palpitations Resp:   shortness of breath with exertion or at rest.                productive cough,   non-productive cough, coughing up of blood.              change in color of mucus.  wheezing.   Skin:    rash or lesions. GI:  No-   heartburn, indigestion, abdominal pain, nausea, vomiting, diarrhea,                 change in bowel habits, loss of appetite GU: dysuria, change in color of urine, no urgency or frequency.   flank pain. MS:   joint pain, stiffness, decreased range  of motion, back pain. Neuro-     nothing unusual Psych:  change in mood or affect.  depression or anxiety.   memory loss.  OBJ- Physical Exam General- Alert, Oriented, Affect-briefly tearful+, Distress- none acute Skin- rash-none, lesions- none, excoriation- none Lymphadenopathy- none Head- atraumatic            Eyes- Gross vision intact, PERRLA, conjunctivae and secretions clear            Ears- Hearing, canals-normal            Nose- Clear, no-Septal dev, mucus, polyps, erosion, perforation             Throat- Mallampati II thin, posteriorly [laced soft palate, mucosa clear , drainage- none, tonsils- atrophic Neck- flexible ,  trachea midline, no stridor , thyroid nl, carotid no bruit Chest - symmetrical excursion , unlabored           Heart/CV- RRR , no murmur , no gallop  , no rub, nl s1 s2                           - JVD- none , edema- none, stasis changes- none, varices- none           Lung- clear to P&A, wheeze- none, cough- none , dullness-none, rub- none           Chest wall-  Abd-  Br/ Gen/ Rectal- Not done, not indicated Extrem- cyanosis- none, clubbing, none, atrophy- none, strength- nl Neuro- grossly intact to observation

## 2023-05-21 ENCOUNTER — Encounter: Payer: Self-pay | Admitting: Internal Medicine

## 2023-05-21 DIAGNOSIS — M25551 Pain in right hip: Secondary | ICD-10-CM | POA: Diagnosis not present

## 2023-05-21 NOTE — Assessment & Plan Note (Signed)
,   Uncomplicated.  Not needing rescue inhaler except in fall season. Plan-continue access to Ventolin HFA.

## 2023-05-21 NOTE — Assessment & Plan Note (Signed)
Benefits from CPAP with improved sleep quality.  It has made the difference in headache management that she had originally hoped for, but life is better off with CPAP.  She asked to change DME. Plan-continue auto 6-16.  Change DME at her request.

## 2023-05-23 DIAGNOSIS — M5416 Radiculopathy, lumbar region: Secondary | ICD-10-CM | POA: Diagnosis not present

## 2023-06-09 ENCOUNTER — Ambulatory Visit: Payer: Medicare Other | Admitting: Sports Medicine

## 2023-06-09 ENCOUNTER — Other Ambulatory Visit: Payer: Self-pay

## 2023-06-09 DIAGNOSIS — M67951 Unspecified disorder of synovium and tendon, right thigh: Secondary | ICD-10-CM

## 2023-06-09 DIAGNOSIS — M722 Plantar fascial fibromatosis: Secondary | ICD-10-CM

## 2023-06-09 DIAGNOSIS — M25551 Pain in right hip: Secondary | ICD-10-CM

## 2023-06-09 NOTE — Progress Notes (Unsigned)
Misty [name] - 72 y.o. female MRN [id number]  Date of birth: 03/24/1951  Office Visit Note: Visit Date: 06/09/2023 PCP: Alysia Penna, MD Referred by: Alysia Penna, MD  Subjective: Chief Complaint  Patient presents with   Right Hip - Pain   Right Foot - Pain   HPI: Misty [name] is a pleasant 72 y.o. female who presents today for acute on chronic right lateral hip pain; also with right heel plantar fasciitis.  Has had chronic right lateral hip pain.  In the past had a palpation guided greater trochanteric injection which did not significantly help her pain.  Would like to try an ultrasound-guided injection.  Pain has been present for years.  Having an aggravation of acute on chronic right plantar fascia pain.  Was recently on a cruise and and performed a lot of walking which aggravated it.  Yesterday she was able to rest and wrap the foot which did provide some relief. Takes OTC anti-inflammatories as needed.  Pertinent ROS were reviewed with the patient and found to be negative unless otherwise specified above in HPI.   Assessment & Plan: Visit Diagnoses:  1. Pain in right hip   2. Tendinopathy of right gluteus medius   3. Greater trochanteric pain syndrome of right lower extremity   4. Plantar fasciitis of right foot    Plan: Discussed with Misty [name] possible etiology of her chronic right lateral hip pain.  We did not perform a diagnostic ultrasound, however upon her limited skin for the injection I did see some signs worrisome more so for gluteus medius or minimus partial tearing.  We did perform ultrasound-guided injection in this region in the greater trochanter, we will see what sort of response she gets that this.  Did print out a customized handout and review hip abduction and strengthening exercises for her to perform once daily.  May use ice or over-the-counter anti-inflammatories for pain control.  She also is having a flare of plantar fasciitis, we did perform a  trial of extracorporeal shockwave therapy.  She may use frozen water bottle, Achilles and plantar fascia stretching and home rehab exercises provided previously to her husband.  Will follow-up in 1 week for additional ECSWT and decide on repeat treatments depending on response.  Would consider MRI of hip if not significantly improving with injection and home rehab therapy.  Follow-up: Return in about 1 week (around 06/16/2023) for for plantar fascia (reg visit).    Meds & Orders: No orders of the defined types were placed in this encounter.   Orders Placed This Encounter  Procedures   Large Joint Inj: R greater trochanter   US Guided Needle Placement - No Linked Charges     Procedures: Large Joint Inj: R greater trochanter on 06/09/2023 3:01 PM Indications: pain Details: 22 G 3.5 in needle, ultrasound-guided lateral approach Medications: 2 mL lidocaine 1 %; 2 mL bupivacaine 0.25 %; 12 mg betamethasone acetate-betamethasone sodium phosphate 6 (3-3) MG/ML Outcome: tolerated well, no immediate complications  US-Guided Greater Trochanteric Bursa and Gluteus med/minimus Injection, Right Hip After discussion on risks/benefits/indications and informed verbal consent was obtained, a timeout was performed. The patient was lying in lateral recumbent position on exam table. Using ultrasound guidance, the greater trochanter was identified. The area overlying the trochanteric bursa was then prepped with Betadine and alcohol swabs. Following sterile precautions, ultrasound was reapplied to visualize needle guidance with a 22-gauge 3.5" needle utilizing an in-plane approach to inject the bursa with 2:2:2 lidocaine:bupivicaine:betamethasone. Delivery  of the injectate was visualized into the region of hypoechoic fluid of the greater trochanteric bursa. Patient tolerated procedure well without immediate complications.    Procedure, treatment alternatives, risks and benefits explained, specific risks discussed.  Consent was given by the patient. Immediately prior to procedure a time out was called to verify the correct patient, procedure, equipment, support staff and site/side marked as required. Patient was prepped and draped in the usual sterile fashion.          Procedure: ECSWT Indications:  Plantar fasciitis, right   Procedure Details Consent: Risks of procedure as well as the alternatives and risks of each were explained to the patient.  Verbal consent for procedure obtained. Time Out: Verified patient identification, verified procedure, site was marked, verified correct patient position. The area was cleaned with alcohol swab.     The right plantar fascia was targeted for Extracorporeal shockwave therapy.    Preset: plantar fasciitis Power Level: 90 mJ Frequency: 10 Hz Impulse/cycles: 3000 Head size: Regular   Patient tolerated procedure well without immediate complications.     Clinical History: No specialty comments available.  She reports that she has never smoked. She has never used smokeless tobacco. No results for input(s): "HGBA1C", "LABURIC" in the last 8760 hours.  Objective:   Vital Signs: LMP  (LMP Unknown)   Physical Exam  Gen: Well-appearing, in no acute distress; non-toxic CV: Regular Rate. Well-perfused. Warm.  Resp: Breathing unlabored on room air; no wheezing. Psych: Fluid speech in conversation; appropriate affect; normal thought process Neuro: Sensation intact throughout. No gross coordination deficits.   Ortho Exam - Right hip: Mild TTP over the right greater trochanter, but more so posteriorly near the mid to insertional attachment of the gluteus medius and minimus.  There were no mechanical blocks about the hip.  There is 4/5 strength with resisted hip abduction compared to 5/5 strength of the contralateral hip.  Otherwise full strength about the hip.  - Right foot/ankle: + TTP over the medial aspect of the plantar calcaneus near the plantar fascia  insertion.  There is localized soft tissue swelling here.  Negative calcaneal heel squeeze.  No significant Achilles contracture.  Patient does ambulate in orthotics.  Imaging: No results found.  Past Medical/Family/Surgical/Social History: Medications & Allergies reviewed per EMR, new medications updated. 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   Non-restorative sleep 09/05/2021   Plantar fasciitis of left foot 03/24/2018   Asthmatic bronchitis without complication 11/13/2014   Seasonal and perennial allergic rhinitis 11/13/2014   Past Medical History:  Diagnosis Date   Allergy    Anxiety    Asthma    allergic asthma due to allergies per pt   Depression    Fundic gland polyps of stomach, benign    GERD (gastroesophageal reflux disease)    Headache    Hiatal hernia    Hypercholesteremia    Insomnia    Osteopenia    Plantar fascia syndrome    Scoliosis    chronic pain   Sleep apnea    wears cpap   Family History  Problem Relation Age of Onset   Dementia Mother    Hypertension Mother    Glaucoma Mother    Heart  disease Father    Rheum arthritis Father    Lupus Father    Aneurysm Father    CAD Father    Hypertension Father    Hyperlipidemia Father    Stroke Maternal Grandfather    Breast cancer Neg Hx    Colon cancer Neg Hx    Stomach cancer Neg Hx    Esophageal cancer Neg Hx    Rectal cancer Neg Hx    Migraines Neg Hx    Colon polyps Neg Hx    Past Surgical History:  Procedure Laterality Date   COLONOSCOPY     excision of morton's neuroma  1986   MYOMECTOMY  1996   NASAL SEPTOPLASTY W/ TURBINOPLASTY N/A 03/25/2022   Procedure: NASAL SEPTOPLASTY WITH TURBINATE REDUCTION;   Surgeon: Newman Pies, MD;  Location: [facility];  Service: ENT;  Laterality: N/A;   POLYPECTOMY     SIGMOIDOSCOPY     TONSILLECTOMY  1966   Social History   Occupational History   Occupation: non Ambulance person  Tobacco Use   Smoking status: Never   Smokeless tobacco: Never  Vaping Use   Vaping Use: Never used  Substance and Sexual Activity   Alcohol use: Not Currently   Drug use: No   Sexual activity: Yes    Partners: Male   I spent 46 minutes in the care of the patient today including face-to-face time, preparation to see the patient, as well as review of previous office notes, discussion with outside provider Elodia Florence, PA-C), counseling and educating the patient on home exercise plan with handout and review, time spent with explanation and treatment of extracorporeal shockwave therapy trial for the above diagnoses.   Madelyn Brunner, DO Primary Care Sports Medicine Physician  [facility] Orth[facility]  This note was dictated using Dragon naturally speaking software and may contain errors in syntax, spelling, or content which have not been identified prior to signing this note.

## 2023-06-09 NOTE — Progress Notes (Signed)
Here today for right hip GT injection Has been bothering her for quite some time  She also has another concern today; right foot plantar fascia pain Aggravated during cruise with all the walking She has it wrapped today for comfort Would like to try shockwave on it if possible  She is ok with holding on injection today in order to focus on foot since that is more bothersome

## 2023-06-10 ENCOUNTER — Encounter: Payer: Self-pay | Admitting: Sports Medicine

## 2023-06-10 MED ORDER — BETAMETHASONE SOD PHOS & ACET 6 (3-3) MG/ML IJ SUSP
12.0000 mg | INTRAMUSCULAR | Status: AC | PRN
Start: 2023-06-09 — End: 2023-06-09
  Administered 2023-06-09: 12 mg via INTRA_ARTICULAR

## 2023-06-10 MED ORDER — BUPIVACAINE HCL 0.25 % IJ SOLN
2.0000 mL | INTRAMUSCULAR | Status: AC | PRN
Start: 2023-06-09 — End: 2023-06-09
  Administered 2023-06-09: 2 mL via INTRA_ARTICULAR

## 2023-06-10 MED ORDER — LIDOCAINE HCL 1 % IJ SOLN
2.0000 mL | INTRAMUSCULAR | Status: AC | PRN
Start: 2023-06-09 — End: 2023-06-09
  Administered 2023-06-09: 2 mL

## 2023-06-12 ENCOUNTER — Ambulatory Visit: Payer: Medicare Other | Admitting: Neurology

## 2023-06-13 ENCOUNTER — Encounter: Payer: Self-pay | Admitting: Internal Medicine

## 2023-06-17 ENCOUNTER — Ambulatory Visit: Payer: Medicare Other | Admitting: Neurology

## 2023-06-18 ENCOUNTER — Ambulatory Visit: Payer: Medicare Other | Admitting: Sports Medicine

## 2023-06-18 ENCOUNTER — Encounter: Payer: Self-pay | Admitting: Sports Medicine

## 2023-06-18 DIAGNOSIS — M722 Plantar fascial fibromatosis: Secondary | ICD-10-CM | POA: Diagnosis not present

## 2023-06-18 DIAGNOSIS — M25551 Pain in right hip: Secondary | ICD-10-CM

## 2023-06-18 DIAGNOSIS — M67951 Unspecified disorder of synovium and tendon, right thigh: Secondary | ICD-10-CM

## 2023-06-18 NOTE — Progress Notes (Signed)
Right hip is doing great; injection helped a lot States she felt a twinge of pain yesterday when she was cutting her toe nails, but thinks its from how she positioned herself  Follow up on both feet; states the treatment helped she could tell Pain is more when she is up and about Trying to limit her steps

## 2023-06-18 NOTE — Progress Notes (Signed)
Misty [name] - 72 y.o. female MRN [id number]  Date of birth: [birth year]  Office Visit Note: Visit Date: 06/18/2023 PCP: Alysia Penna, MD Referred by: Alysia Penna, MD  Subjective: Chief Complaint  Patient presents with   Right Hip - Follow-up   Right Foot - Follow-up   Left Foot - Follow-up   HPI: Misty [name] is a pleasant 72 y.o. female who presents today for follow-up of right lateral hip pain and R-heel plantar fasciitis.  R-hip -feeling excellent with good relief of her pain after the ultrasound-guided greater trochanteric and gluteus medius injection.  Has been able to perform her home hip strengthening exercises without much difficulty or pain.  Right heel -did find some improvement for the first day or so after the shockwave treatment therapy, overall has helped but still has some pain, worse with being on her feet for long periods of time.  Pertinent ROS were reviewed with the patient and found to be negative unless otherwise specified above in HPI.   Assessment & Plan: Visit Diagnoses:  1. Plantar fasciitis of right foot   2. Tendinopathy of right gluteus medius   3. Greater trochanteric pain syndrome of right lower extremity    Plan: Discussed with Elnita Maxwell that I am glad she got such excellent relief from the ultrasound-guided greater trochanter and gluteus medius injection, I would like her to continue her home hip stabilization exercises to provide prolonged benefit.  I did discuss with her if this is only short-term relief for 1 week repeat her strength testing this is weak again, it could make sense to obtain an MRI to evaluate for evidence of tearing, hopefully this will not be needed as she is making large improvements.  We did repeat extracorporeal shockwave treatment therapy for her right foot plan fasciitis, tolerated well.  Will follow-up next week and see what sort of relief she receives and continue repeat treatments if finding improvement.  Did  discuss with her that her custom orthotics are starting to wear out over the heel and forefoot region, we will have her consider stopping by [facility] to evaluate these and see if she needs replacements or more padding.   Follow-up: F/u in 1-week   Meds & Orders: No orders of the defined types were placed in this encounter.  No orders of the defined types were placed in this encounter.    Procedures: Procedure: ECSWT Indications:  Plantar fasciitis, right   Procedure Details Consent: Risks of procedure as well as the alternatives and risks of each were explained to the patient.  Verbal consent for procedure obtained. Time Out: Verified patient identification, verified procedure, site was marked, verified correct patient position. The area was cleaned with alcohol swab.     The right plantar fascia was targeted for Extracorporeal shockwave therapy.    Preset: plantar fasciitis Power Level: 100 mJ Frequency: 10 Hz Impulse/cycles: 3400 Head size: Regular   Patient tolerated procedure well without immediate complications      Clinical History: No specialty comments available.  She reports that she has never smoked. She has never used smokeless tobacco. No results for input(s): "HGBA1C", "LABURIC" in the last 8760 hours.  Objective:   Vital Signs: LMP  (LMP Unknown)   Physical Exam  Gen: Well-appearing, in no acute distress; non-toxic CV: Well-perfused. Warm.  Resp: Breathing unlabored on room air; no wheezing. Psych: Fluid speech in conversation; appropriate affect; normal thought process Neuro: Sensation intact throughout. No gross coordination deficits.   Ortho Exam -  Right foot: Mild TTP over the medial aspect of the plantar calcaneus near the plantar fascia insertion.  No redness or swelling.  There is full range of motion with dorsiflexion and plantarflexion.  Imaging: No results found.  Past Medical/Family/Surgical/Social History: Medications & Allergies reviewed per EMR,  new medications updated. 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   Non-restorative sleep 09/05/2021   Plantar fasciitis of left foot 03/24/2018   Asthmatic bronchitis without complication 11/13/2014   Seasonal and perennial allergic rhinitis 11/13/2014   Past Medical History:  Diagnosis Date   Allergy    Anxiety    Asthma    allergic asthma due to allergies per pt   Depression    Fundic gland polyps of stomach, benign    GERD (gastroesophageal reflux disease)    Headache    Hiatal hernia    Hypercholesteremia    Insomnia    Osteopenia    Plantar fascia syndrome    Scoliosis    chronic pain   Sleep apnea    wears cpap   Family History  Problem Relation Age of Onset   Dementia Mother    Hypertension Mother    Glaucoma Mother    Heart disease Father    Rheum arthritis Father    Lupus Father    Aneurysm Father    CAD Father    Hypertension Father    Hyperlipidemia Father    Stroke Maternal Grandfather    Breast cancer Neg Hx    Colon cancer Neg Hx    Stomach cancer Neg Hx    Esophageal cancer Neg Hx    Rectal cancer Neg Hx    Migraines Neg Hx    Colon polyps Neg Hx    Past Surgical History:  Procedure Laterality Date   COLONOSCOPY     excision of morton's neuroma  1986   MYOMECTOMY  1996   NASAL SEPTOPLASTY W/ TURBINOPLASTY N/A 03/25/2022   Procedure: NASAL SEPTOPLASTY WITH TURBINATE REDUCTION;  Surgeon: Newman Pies, MD;  Location: [facility];  Service: ENT;  Laterality: N/A;   POLYPECTOMY     SIGMOIDOSCOPY     TONSILLECTOMY  1966   Social History   Occupational History   Occupation: non Ambulance person  Tobacco Use   Smoking status: Never   Smokeless  tobacco: Never  Vaping Use   Vaping Use: Never used  Substance and Sexual Activity   Alcohol use: Not Currently   Drug use: No   Sexual activity: Yes    Partners: Male

## 2023-06-19 ENCOUNTER — Encounter: Payer: Self-pay | Admitting: Sports Medicine

## 2023-06-25 ENCOUNTER — Encounter: Payer: Self-pay | Admitting: Sports Medicine

## 2023-06-25 ENCOUNTER — Ambulatory Visit: Payer: Medicare Other | Admitting: Sports Medicine

## 2023-06-25 DIAGNOSIS — M79671 Pain in right foot: Secondary | ICD-10-CM | POA: Diagnosis not present

## 2023-06-25 DIAGNOSIS — M722 Plantar fascial fibromatosis: Secondary | ICD-10-CM | POA: Diagnosis not present

## 2023-06-25 DIAGNOSIS — M25551 Pain in right hip: Secondary | ICD-10-CM

## 2023-06-25 DIAGNOSIS — M67951 Unspecified disorder of synovium and tendon, right thigh: Secondary | ICD-10-CM

## 2023-06-25 MED ORDER — METHYLPREDNISOLONE 4 MG PO TBPK: ORAL_TABLET | ORAL | 0 refills | Status: DC

## 2023-06-25 NOTE — Addendum Note (Signed)
Addended by: Jamie Kato III on: 06/25/2023 12:17 PM   Modules accepted: Orders

## 2023-06-25 NOTE — Progress Notes (Signed)
Not doing any better; in a lot of pain today with the foot

## 2023-06-25 NOTE — Progress Notes (Signed)
Misty [name] - 72 y.o. female MRN [id number]  Date of birth: 11/04/1951  Office Visit Note: Visit Date: 06/25/2023 PCP: Alysia Penna, MD Referred by: Alysia Penna, MD  Subjective: Chief Complaint  Patient presents with   Right Foot - Follow-up   HPI: Misty [name] is a pleasant 72 y.o. female who presents today for follow-up of right foot pain with suspected plantar fasciitis and R-lateral hip pain.  Has underwent 2 sessions of extracorporeal shockwave therapy.  Unfortunately has not had much relief from shockwave treatments yet.  She states in the past when she had this performed years ago they did at least 6 sessions and she was intermittently in and out of a walking boot as well. Took a while at that time to heal. Even was casted briefly. Her current pain is worse with for steps in the morning as well as then being on her feet for longer periods of the day.  Will migrate into the arch of the foot slightly but more so over the heel.  Also still doing well from ultrasound-guided greater trochanteric injection.  Feels like the hips are bothering her more in general since she is walking differently because of the plantar fascia pain.  She is leaving for an upcoming trip to the mountains today.  Pertinent ROS were reviewed with the patient and found to be negative unless otherwise specified above in HPI.   Assessment & Plan: Visit Diagnoses:  1. Plantar fasciitis of right foot   2. Tendinopathy of right gluteus medius   3. Greater trochanteric pain syndrome of right lower extremity    Plan: Discussed with Elnita Maxwell the possible etiology of her right heel pain, her exam and symptomatology certainly suggest plantar fasciitis.  She unfortunately has not had much relief from extracorporeal shockwave therapy.  We did increase settings today and repeat a trial of ESWT.  I would like her to continue her Achilles and plantar fascia stretching and home rehab exercises once daily.  Given  the severity of her pain, we will start her on a 6-day Medrol Dosepak and did fit her for a short leg walking boot which she will wear only a day or 2 at a time if her pain is flared up.  She is understanding not to stay in this consistently.  Did place a heel lift in the other shoe to help balance the hips.  We will see her back in about 3 weeks and see the degree of improvement she is having.  If there is still rather significant pain about the heel, we may consider an MRI of the foot/ankle to ensure we are not missing any occult stress fracture or other pathology, but at this time it does seem quite consistent for plantar fasciitis.  We will recheck her hip strength as well at that visit to ensure she is making progress with her home rehab.  Could consider MRI of the hip to evaluate the quality of the gluteus medius and minimus as this ultrasound suggested possible tearing in this location.  Follow-up: F/u in 3 weeks (7/12 appt)   Meds & Orders:  Meds ordered this encounter  Medications   methylPREDNISolone (MEDROL DOSEPAK) 4 MG TBPK tablet    Sig: Take per packet instructions.    Dispense:  1 each    Refill:  0   No orders of the defined types were placed in this encounter.    Procedures: Procedure: ECSWT Indications:  Plantar fasciitis, right   Procedure Details  Consent: Risks of procedure as well as the alternatives and risks of each were explained to the patient.  Verbal consent for procedure obtained. Time Out: Verified patient identification, verified procedure, site was marked, verified correct patient position. The area was cleaned with alcohol swab.     The right plantar fascia was targeted for Extracorporeal shockwave therapy.    Preset: plantar fasciitis Power Level: 110 mJ  Frequency: 12 Hz Impulse/cycles: 3200 Head size: Regular   Patient tolerated procedure well without immediate complications      Clinical History: No specialty comments available.  She reports  that she has never smoked. She has never used smokeless tobacco. No results for input(s): "HGBA1C", "LABURIC" in the last 8760 hours.  Objective:   Vital Signs: LMP  (LMP Unknown)   Physical Exam  Gen: Well-appearing, in no acute distress; non-toxic CV: Well-perfused. Warm.  Resp: Breathing unlabored on room air; no wheezing. Psych: Fluid speech in conversation; appropriate affect; normal thought process Neuro: Sensation intact throughout. No gross coordination deficits.   Ortho Exam - Hips: Ugly antalgic gait.  Negative Trendelenburg.  - Right foot: Positive TTP over the medial aspect of the calcaneus near the plantar fascia insertion.  Negative calcaneal heel squeeze and pain at the distal Achilles.   Imaging: No results found.  Past Medical/Family/Surgical/Social History: Medications & Allergies reviewed per EMR, new medications updated. 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   Non-restorative sleep 09/05/2021   Plantar fasciitis of left foot 03/24/2018   Asthmatic bronchitis without complication 11/13/2014   Seasonal and perennial allergic rhinitis 11/13/2014   Past Medical History:  Diagnosis Date   Allergy    Anxiety    Asthma    allergic asthma due to allergies per pt   Depression    Fundic gland polyps of stomach, benign    GERD (gastroesophageal reflux disease)    Headache    Hiatal hernia    Hypercholesteremia    Insomnia    Osteopenia    Plantar fascia syndrome    Scoliosis    chronic pain   Sleep apnea    wears cpap   Family History  Problem Relation Age of Onset   Dementia Mother    Hypertension Mother    Glaucoma Mother    Heart disease Father    Rheum arthritis  Father    Lupus Father    Aneurysm Father    CAD Father    Hypertension Father    Hyperlipidemia Father    Stroke Maternal Grandfather    Breast cancer Neg Hx    Colon cancer Neg Hx    Stomach cancer Neg Hx    Esophageal cancer Neg Hx    Rectal cancer Neg Hx    Migraines Neg Hx    Colon polyps Neg Hx    Past Surgical History:  Procedure Laterality Date   COLONOSCOPY     excision of morton's neuroma  1986   MYOMECTOMY  1996   NASAL SEPTOPLASTY W/ TURBINOPLASTY N/A 03/25/2022   Procedure: NASAL SEPTOPLASTY WITH TURBINATE REDUCTION;  Surgeon: Newman Pies, MD;  Location: [facility];  Service: ENT;  Laterality: N/A;   POLYPECTOMY     SIGMOIDOSCOPY  TONSILLECTOMY  1966   Social History   Occupational History   Occupation: non Ambulance person  Tobacco Use   Smoking status: Never   Smokeless tobacco: Never  Vaping Use   Vaping Use: Never used  Substance and Sexual Activity   Alcohol use: Not Currently   Drug use: No   Sexual activity: Yes    Partners: Male

## 2023-07-11 ENCOUNTER — Encounter: Payer: Self-pay | Admitting: Sports Medicine

## 2023-07-11 ENCOUNTER — Ambulatory Visit: Payer: Medicare Other | Admitting: Sports Medicine

## 2023-07-11 DIAGNOSIS — M722 Plantar fascial fibromatosis: Secondary | ICD-10-CM

## 2023-07-11 NOTE — Progress Notes (Signed)
She is doing great; states prednisone helped and the boot  Pain is minimal today

## 2023-07-11 NOTE — Progress Notes (Signed)
Misty [name] - 72 y.o. female MRN [id number]  Date of birth: 23-Mar-1951  Office Visit Note: Visit Date: 07/11/2023 PCP: Alysia Penna, MD Referred by: Alysia Penna, MD  Subjective: Chief Complaint  Patient presents with   Right Foot - Follow-up   HPI: Misty [name] is a pleasant 72 y.o. female who presents today for follow-up of right foot plantar fasciitis.  Last visit we did perform extracorporeal shockwave therapy as well as brief immobilization in a walking boot for few days.  She also completed a 6-day Medrol Dosepak.  With all these treatment modalities her foot pain/plantar fasciitis pain is markedly improved.  [place] much pain at all.  She did see our partners at the sports medicine center and they did place a scaphoid pad into her arch support for the orthotic.  These are starting to wear down, she may consider having this replaced.  Overall, she is doing very well and pleased with her improvement.  Pertinent ROS were reviewed with the patient and found to be negative unless otherwise specified above in HPI.   Assessment & Plan: Visit Diagnoses:  1. Plantar fasciitis of right foot    Plan: Both [name] and I are pleased with her dramatic improvement in her plantar fasciitis.  I do think this was a combination of extracorporeal shockwave therapy, her Medrol Dosepak as well as her frequent mobilization.  We did repeat ESWT today, she may go back into the walking boot for today and tomorrow and then may wean herself out of this.  She will continue nine-hole orthotics with her new scaphoid pad.  We did discuss these are starting to wear out, she may consider seeing these with a new pair of custom orthotics sometime in the near future.  We will follow-up in about 10 days for reevaluation and consideration of 1 additional shockwave treatment therapy.  Follow-up: Return in about 10 days (around 07/21/2023).   Meds &  Orders: No orders of the defined types were placed in this encounter.  No orders of the defined types were placed in this encounter.    Procedures: Procedure: ECSWT Indications:  Plantar fasciitis, right   Procedure Details Consent: Risks of procedure as well as the alternatives and risks of each were explained to the patient.  Verbal consent for procedure obtained. Time Out: Verified patient identification, verified procedure, site was marked, verified correct patient position. The area was cleaned with alcohol swab.     The right plantar fascia was targeted for Extracorporeal shockwave therapy.    Preset: plantar fasciitis Power Level: 110 mJ  Frequency: 12 Hz Impulse/cycles: 3000 Head size: Regular   Patient tolerated procedure well without immediate complications       Clinical History: No specialty comments available.  She reports that she has never smoked. She has never used smokeless tobacco. No results for input(s): "HGBA1C", "LABURIC" in the last 8760 hours.  Objective:   Vital Signs: LMP  (LMP Unknown)   Physical Exam  Gen: Well-appearing, in no acute distress; non-toxic CV:  Well-perfused. Warm.  Resp: Breathing unlabored on room air; no wheezing. Psych: Fluid speech in conversation; appropriate affect; normal thought process Neuro: Sensation intact throughout. No gross coordination deficits.   Ortho Exam -Right foot: No true TTP over the calcaneus or the plantar fascia insertion today.  Negative calcaneal heel squeeze.  There is full range of motion with dorsiflexion and plantarflexion of the ankle.  Imaging:  No results found.  Past Medical/Family/Surgical/Social History: Medications & Allergies reviewed per EMR, new medications updated. Patient Active Problem List   Diagnosis Date Noted   Insomnia    OSA on CPAP 02/18/2022   Chronic migraine without aura without status migrainosus, not intractable 01/16/2022   Witnessed episode of apnea 09/05/2021    Worsening headaches 09/05/2021   Morning headache 09/05/2021   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   Non-restorative sleep 09/05/2021   Plantar fasciitis of left foot 03/24/2018   Asthmatic bronchitis without complication 11/13/2014   Seasonal and perennial allergic rhinitis 11/13/2014   Past Medical History:  Diagnosis Date   Allergy    Anxiety    Asthma    allergic asthma due to allergies per pt   Depression    Fundic gland polyps of stomach, benign    GERD (gastroesophageal reflux disease)    Headache    Hiatal hernia    Hypercholesteremia    Insomnia    Osteopenia    Plantar fascia syndrome    Scoliosis    chronic pain   Sleep apnea    wears cpap   Family History  Problem Relation Age of Onset   Dementia Mother    Hypertension Mother    Glaucoma Mother    Heart disease Father    Rheum arthritis Father    Lupus Father    Aneurysm Father    CAD Father    Hypertension Father    Hyperlipidemia Father    Stroke Maternal Grandfather    Breast cancer Neg Hx    Colon cancer Neg Hx    Stomach cancer Neg Hx    Esophageal cancer Neg Hx    Rectal cancer Neg Hx    Migraines Neg Hx    Colon polyps Neg Hx    Past Surgical History:  Procedure Laterality Date   COLONOSCOPY     excision of morton's neuroma  1986   MYOMECTOMY  1996   NASAL SEPTOPLASTY W/ TURBINOPLASTY N/A 03/25/2022   Procedure: NASAL SEPTOPLASTY WITH TURBINATE REDUCTION;  Surgeon: Newman Pies, MD;  Location: [facility];  Service: ENT;  Laterality: N/A;   POLYPECTOMY     SIGMOIDOSCOPY     TONSILLECTOMY  1966   Social History   Occupational History   Occupation: non Ambulance person  Tobacco Use   Smoking status: Never   Smokeless tobacco: Never  Vaping Use   Vaping status: Never Used  Substance and Sexual Activity   Alcohol use: Not Currently   Drug use: No   Sexual activity: Yes    Partners: Male

## 2023-07-21 ENCOUNTER — Encounter: Payer: Self-pay | Admitting: Sports Medicine

## 2023-07-21 ENCOUNTER — Ambulatory Visit: Payer: Medicare Other | Admitting: Sports Medicine

## 2023-07-21 DIAGNOSIS — M722 Plantar fascial fibromatosis: Secondary | ICD-10-CM

## 2023-07-21 NOTE — Progress Notes (Signed)
Misty [name] - 72 y.o. female MRN [id number]  Date of birth: March 28, 1951  Office Visit Note: Visit Date: 07/21/2023 PCP: Alysia Penna, MD Referred by: Alysia Penna, MD  Subjective: No chief complaint on file.  HPI: Misty [name] is a pleasant 72 y.o. female who presents today for follow-up of right foot plantar fasciitis.  Right foot is doing significantly better.  She has noticed the vast improvement with shockwave, 1 prior dose of Medrol Dosepak, temporary cam walker boot.  We have performed 4 sessions of extracorporeal shockwave therapy, finding benefit.  Essentially having no pain, still feels a very small twinge in this location of the heel.  Still doing some of her plantar fascia and stretching and strengthening exercises.  Pertinent ROS were reviewed with the patient and found to be negative unless otherwise specified above in HPI.   Assessment & Plan: Visit Diagnoses:  1. Plantar fasciitis of right foot    Plan: Misty [name] is nearly 100% improved from her plantar fascia pain of the right foot.  We did repeat 1 additional extracorporeal shockwave therapy today to augment healing, she tolerated well.  She may go back into the walking boot just for today and tomorrow and then may wean out of this completely.  She has this only for breakthrough pain.  She will continue in her orthotics, as well as her home rehab exercises for the plantar fascia and posterior heel cord. She may transition to performing these more for maintenance at a 2-3x/week interval. She will follow-up with me as needed.   Follow-up: Return if symptoms worsen or fail to improve.   Meds & Orders: No orders of the defined types were placed in this encounter.  No orders of the defined types were placed in this encounter.    Procedures: Procedure: ECSWT Indications:  Plantar fasciitis, right   Procedure Details Consent: Risks of procedure as well as the alternatives and risks of each were explained to  the patient.  Verbal consent for procedure obtained. Time Out: Verified patient identification, verified procedure, site was marked, verified correct patient position. The area was cleaned with alcohol swab.     The right plantar fascia was targeted for Extracorporeal shockwave therapy.    Preset: plantar fasciitis Power Level: 110 mJ  Frequency: 12 Hz Impulse/cycles: 2800 Head size: Regular   Patient tolerated procedure well without immediate complications      Clinical History: No specialty comments available.  She reports that she has never smoked. She has never used smokeless tobacco. No results for input(s): "HGBA1C", "LABURIC" in the last 8760 hours.  Objective:   Vital Signs: LMP  (LMP Unknown)   Physical Exam  Gen: Well-appearing, in no acute distress; non-toxic CV:  Well-perfused. Warm.  Resp: Breathing unlabored on room air; no wheezing. Psych: Fluid speech in conversation; appropriate affect; normal thought process Neuro: Sensation intact throughout. No gross coordination deficits.   Ortho Exam - Right foot/ankle: No true TTP over the calcaneus or the plantar fascia insertion today.  Negative calcaneal heel squeeze.  There is full range of motion with dorsiflexion and plantarflexion of the ankle.  Nonantalgic gait.  Imaging: No results found.  Past Medical/Family/Surgical/Social History: Medications & Allergies reviewed per EMR, new medications updated. Patient Active Problem List   Diagnosis Date Noted   Insomnia    OSA on CPAP 02/18/2022   Chronic migraine without aura without status migrainosus, not intractable 01/16/2022   Witnessed episode of apnea 09/05/2021   Worsening headaches 09/05/2021  Morning headache 09/05/2021   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   Non-restorative sleep 09/05/2021   Plantar fasciitis of left foot 03/24/2018   Asthmatic bronchitis without complication 11/13/2014   Seasonal and perennial allergic rhinitis 11/13/2014    Past Medical History:  Diagnosis Date   Allergy    Anxiety    Asthma    allergic asthma due to allergies per pt   Depression    Fundic gland polyps of stomach, benign    GERD (gastroesophageal reflux disease)    Headache    Hiatal hernia    Hypercholesteremia    Insomnia    Osteopenia    Plantar fascia syndrome    Scoliosis    chronic pain   Sleep apnea    wears cpap   Family History  Problem Relation Age of Onset   Dementia Mother    Hypertension Mother    Glaucoma Mother    Heart disease Father    Rheum arthritis Father    Lupus Father    Aneurysm Father    CAD Father    Hypertension Father    Hyperlipidemia Father    Stroke Maternal Grandfather    Breast cancer Neg Hx    Colon cancer Neg Hx    Stomach cancer Neg Hx    Esophageal cancer Neg Hx    Rectal cancer Neg Hx    Migraines Neg Hx    Colon polyps Neg Hx    Past Surgical History:  Procedure Laterality Date   COLONOSCOPY     excision of morton's neuroma  1986   MYOMECTOMY  1996   NASAL SEPTOPLASTY W/ TURBINOPLASTY N/A 03/25/2022   Procedure: NASAL SEPTOPLASTY WITH TURBINATE REDUCTION;  Surgeon: Newman Pies, MD;  Location: [facility];  Service: ENT;  Laterality: N/A;   POLYPECTOMY     SIGMOIDOSCOPY     TONSILLECTOMY  1966   Social History   Occupational History   Occupation: non Ambulance person  Tobacco Use   Smoking status: Never   Smokeless tobacco: Never  Vaping Use   Vaping status: Never Used  Substance and Sexual Activity   Alcohol use: Not Currently   Drug use: No   Sexual activity: Yes    Partners: Male

## 2023-07-21 NOTE — Progress Notes (Signed)
Foot is doing a lot better States she can tell a huge difference after the boot, shockwave, prednisone

## 2023-07-22 DIAGNOSIS — K080 Exfoliation of teeth due to systemic causes: Secondary | ICD-10-CM | POA: Diagnosis not present

## 2023-07-25 DIAGNOSIS — G43719 Chronic migraine without aura, intractable, without status migrainosus: Secondary | ICD-10-CM | POA: Diagnosis not present

## 2023-07-25 DIAGNOSIS — R030 Elevated blood-pressure reading, without diagnosis of hypertension: Secondary | ICD-10-CM | POA: Diagnosis not present

## 2023-07-25 DIAGNOSIS — G4701 Insomnia due to medical condition: Secondary | ICD-10-CM | POA: Diagnosis not present

## 2023-08-05 DIAGNOSIS — Z7189 Other specified counseling: Secondary | ICD-10-CM | POA: Diagnosis not present

## 2023-08-05 DIAGNOSIS — L570 Actinic keratosis: Secondary | ICD-10-CM | POA: Diagnosis not present

## 2023-08-05 DIAGNOSIS — D485 Neoplasm of uncertain behavior of skin: Secondary | ICD-10-CM | POA: Diagnosis not present

## 2023-08-27 ENCOUNTER — Encounter: Payer: Self-pay | Admitting: Sports Medicine

## 2023-11-25 ENCOUNTER — Encounter: Payer: Self-pay | Admitting: Sports Medicine

## 2023-11-25 ENCOUNTER — Ambulatory Visit: Payer: Medicare Other | Admitting: Sports Medicine

## 2023-11-25 ENCOUNTER — Other Ambulatory Visit (INDEPENDENT_AMBULATORY_CARE_PROVIDER_SITE_OTHER): Payer: Medicare Other

## 2023-11-25 DIAGNOSIS — M25571 Pain in right ankle and joints of right foot: Secondary | ICD-10-CM

## 2023-11-25 DIAGNOSIS — M722 Plantar fascial fibromatosis: Secondary | ICD-10-CM | POA: Diagnosis not present

## 2023-11-25 DIAGNOSIS — M7051 Other bursitis of knee, right knee: Secondary | ICD-10-CM

## 2023-11-25 DIAGNOSIS — M7660 Achilles tendinitis, unspecified leg: Secondary | ICD-10-CM | POA: Diagnosis not present

## 2023-11-25 NOTE — Progress Notes (Signed)
Patient says that her plantar fasciitis is bothering her again, although this time is more so in the middle and right side of her heel.  Patient also mentions falling about 1 month ago into her hands and knees. She says she did not notice any bruising or much pain, but about 1 week ago noticed some fluid in the front of the right knee. She says that she used ice and wrapped it and it has nearly gone away, and is not really painful.

## 2023-11-25 NOTE — Progress Notes (Signed)
Misty [name] - 72 y.o. female MRN [id number]  Date of birth: 1951-07-24  Office Visit Note: Visit Date: 11/25/2023 PCP: Alysia Penna, MD Referred by: Alysia Penna, MD  Subjective: Chief Complaint  Patient presents with   Right Heel - Pain   Right Knee - Pain   HPI: Misty [name] is a pleasant 72 y.o. female who presents today for follow-up of right foot pain/plantar fasciitis.  Misty [name] has been dealing with acute on chronic plantar fasciitis.  We did treat this with 5 total sessions of extracorporeal shockwave therapy back in June/July where she had essentially 100% relief of her pain.  She has done HEP and stretching for the posterior cord and her plantar fascia.  Recently she and her husband Misty [name] have been up in the mountains.   Here recently and her plantar fascia has been bothering her again although now she is also having pain over the undersurface and right side of the heel as well.  She does also experience a mild burning sensation over the distal part of the Achilles.  She has been helping in [place] after the [place] and has been walking on very hard ground.  Did notice that her current orthotics only had a scaphoid pad in the one shoe but not the contralateral side, wondering if this is contributing.  About once ago she also had a mechanical fall where she fell onto her her knees and hands.  About a week after this she noticed some fluid over the superficial aspect of the right knee.  It was never really painful but she did notice some swelling she has wrapped it and applied ice and the swelling has essentially gone away.  Pertinent ROS were reviewed with the patient and found to be negative unless otherwise specified above in HPI.   Assessment & Plan: Visit Diagnoses:  1. Plantar fasciitis of right foot   2. Pain in right ankle and joints of right foot   3. Infrapatellar bursitis of right knee   4. Distal Achilles tendinitis    Plan: Misty [name] is having an  exacerbation of her chronic heel and plantar fascia pain on the right side, in the past this has responded very well to shockwave therapy.  She is however having more pain over the plantar aspect of the calcaneus and the lateral side which was not her typical presentation.  X-rays do show some spurring and calcification likely in the region of the plantar fascia.  She is experiencing a degree of distal Achilles tendinitis as well likely being from on her feet for long periods of time.  We did perform extracorporeal shockwave therapy both for the plantar fascia/heel as well as her distal Achilles today.  We discussed the role of a low-dose of prednisone to help with the tendinitis portion, we will hold on this but if she is not receiving good relief from today's or next week's treatment we may consider a low-dose of 20 mg for about 1 week.  She did have a mechanical fall 1 month ago onto the right knee and has both exam findings and a story very consistent with traumatic infrapatellar bursitis.  This is not bothering her at all with pain and the swelling dramatically improved with ice and compression.  She will continue this.  If for some reason this returns, we could consider ultrasounding the knee.  She will follow-up in 1 week for reevaluation as well as repeat shockwave therapy.  She will bring in her orthotics  at next appointment, we could consider matching her scaphoid pad to help support both longitudinal arches if she finds that comfortable.  Given the different presentation than her usual plantar fasciitis, if she is not getting benefit from shockwave and above treatment modalities, could consider an MRI of the foot/heel for further evaluation.  Follow-up: Next week   Meds & Orders: No orders of the defined types were placed in this encounter.   Orders Placed This Encounter  Procedures   XR Ankle 2 Views Right     Procedures: Procedure: ECSWT Indications:  Plantar fasciitis, right    Procedure Details Consent: Risks of procedure as well as the alternatives and risks of each were explained to the patient.  Verbal consent for procedure obtained. Time Out: Verified patient identification, verified procedure, site was marked, verified correct patient position. The area was cleaned with alcohol swab.     The right plantar fascia and the distal achilles was targeted for Extracorporeal shockwave therapy.    Preset: plantar fasciitis/Achillodynia Power Level: 110 mJ  Frequency: 12 Hz Impulse/cycles: 3200 (1600 PF; 1600 distal achilles/calc) Head size: Regular   Patient tolerated procedure well without immediate complications       Clinical History: No specialty comments available.  She reports that she has never smoked. She has never used smokeless tobacco. No results for input(s): "HGBA1C", "LABURIC" in the last 8760 hours.  Objective:   Vital Signs: LMP  (LMP Unknown)   Physical Exam  Gen: Well-appearing, in no acute distress; non-toxic CV:  Well-perfused. Warm.  Resp: Breathing unlabored on room air; no wheezing. Psych: Fluid speech in conversation; appropriate affect; normal thought process Neuro: Sensation intact throughout. No gross coordination deficits.   Ortho Exam - Right foot: There is tenderness over the plantar calcaneus as well as the distal Achilles and insertion on the superior calcaneus.  No redness swelling or effusion here.  - Right knee: There is a small pocket of fluid with fluctuance over the inferior aspect of the patella near the infrapatellar bursa.  There is no warmth to this region.  Nontender to palpation.  Full range of motion with knee flexion and extension from 0-130 degrees.  Imaging: XR Ankle 2 Views Right  Result Date: 11/25/2023 2 views of the right ankle including AP and lateral film were ordered and reviewed by myself.  X-rays show a moderate-sized plantar calcaneal spur as well as a calcification just inferior to the  calcaneus, there is enthesophyte at change of the superior calcaneus.  No acute fracture noted.   Past Medical/Family/Surgical/Social History: Medications & Allergies reviewed per EMR, new medications updated. 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   Non-restorative sleep 09/05/2021   Plantar fasciitis of left foot 03/24/2018   Asthmatic bronchitis without complication 11/13/2014   Seasonal and perennial allergic rhinitis 11/13/2014   Past Medical History:  Diagnosis Date   Allergy    Anxiety    Asthma    allergic asthma due to allergies per pt   Depression    Fundic gland polyps of stomach, benign    GERD (gastroesophageal reflux disease)    Headache    Hiatal hernia    Hypercholesteremia    Insomnia    Osteopenia  Plantar fascia syndrome    Scoliosis    chronic pain   Sleep apnea    wears cpap   Family History  Problem Relation Age of Onset   Dementia Mother    Hypertension Mother    Glaucoma Mother    Heart disease Father    Rheum arthritis Father    Lupus Father    Aneurysm Father    CAD Father    Hypertension Father    Hyperlipidemia Father    Stroke Maternal Grandfather    Breast cancer Neg Hx    Colon cancer Neg Hx    Stomach cancer Neg Hx    Esophageal cancer Neg Hx    Rectal cancer Neg Hx    Migraines Neg Hx    Colon polyps Neg Hx    Past Surgical History:  Procedure Laterality Date   COLONOSCOPY     excision of morton's neuroma  1986   MYOMECTOMY  1996   NASAL SEPTOPLASTY W/ TURBINOPLASTY N/A 03/25/2022   Procedure: NASAL SEPTOPLASTY WITH TURBINATE REDUCTION;  Surgeon: Newman Pies, MD;  Location: [facility];  Service: ENT;  Laterality: N/A;    POLYPECTOMY     SIGMOIDOSCOPY     TONSILLECTOMY  1966   Social History   Occupational History   Occupation: non Ambulance person  Tobacco Use   Smoking status: Never   Smokeless tobacco: Never  Vaping Use   Vaping status: Never Used  Substance and Sexual Activity   Alcohol use: Not Currently   Drug use: No   Sexual activity: Yes    Partners: Male   I spent 34 minutes in the care of the patient today including face-to-face time, preparation to see the patient, as well as review of imaging, discussion on possible pharmacological management as well as other alternative treatment options, orthotic modification for the above diagnoses.   Madelyn Brunner, DO Primary Care Sports Medicine Physician  [facility]  This note was dictated using Dragon naturally speaking software and may contain errors in syntax, spelling, or content which have not been identified prior to signing this note.

## 2023-12-02 ENCOUNTER — Encounter: Payer: Self-pay | Admitting: Sports Medicine

## 2023-12-02 ENCOUNTER — Ambulatory Visit: Payer: Medicare Other | Admitting: Sports Medicine

## 2023-12-02 DIAGNOSIS — H0279 Other degenerative disorders of eyelid and periocular area: Secondary | ICD-10-CM | POA: Diagnosis not present

## 2023-12-02 DIAGNOSIS — M25571 Pain in right ankle and joints of right foot: Secondary | ICD-10-CM

## 2023-12-02 DIAGNOSIS — R269 Unspecified abnormalities of gait and mobility: Secondary | ICD-10-CM | POA: Diagnosis not present

## 2023-12-02 DIAGNOSIS — H02413 Mechanical ptosis of bilateral eyelids: Secondary | ICD-10-CM | POA: Diagnosis not present

## 2023-12-02 DIAGNOSIS — H02834 Dermatochalasis of left upper eyelid: Secondary | ICD-10-CM | POA: Diagnosis not present

## 2023-12-02 DIAGNOSIS — M722 Plantar fascial fibromatosis: Secondary | ICD-10-CM | POA: Diagnosis not present

## 2023-12-02 DIAGNOSIS — H02831 Dermatochalasis of right upper eyelid: Secondary | ICD-10-CM | POA: Diagnosis not present

## 2023-12-02 NOTE — Progress Notes (Signed)
Patient says that she is feeling about 80% better after the first treatment. She said that she was on her feet a lot yesterday but with that is still feeling better than she was last appointment. She says her knee is feeling about the same.

## 2023-12-02 NOTE — Progress Notes (Signed)
Misty [name] - 72 y.o. female MRN [id number]  Date of birth: 05-Apr-1951  Office Visit Note: Visit Date: 12/02/2023 PCP: Alysia Penna, MD Referred by: Alysia Penna, MD  Subjective: Chief Complaint  Patient presents with   Right Heel - Follow-up   HPI: Misty [name] is a pleasant 72 y.o. female who presents today for follow-up of right foot pain/plantar fasciitis.  In the past, she has received good relief from extracorporeal shockwave therapy back in June/July with essentially resolution of her pain.  1 week ago on 11/25/2023 we did repeat a shockwave treatment which gave her good relief and at this point she feels like she is at least 80% improved.   Bring her orthotics in today as she feels like her custom orthotics from [facility] are causing her right foot to push outward/supinate.  Pertinent ROS were reviewed with the patient and found to be negative unless otherwise specified above in HPI.   Assessment & Plan: Visit Diagnoses:  1. Plantar fasciitis of right foot   2. Pain in right ankle and joints of right foot   3. Gait abnormality    Plan: Misty [name] had excellent response to the first treatment of extracorporeal shockwave therapy for her plantar fasciitis and heel pain.  We did repeat ESWT today.  We did modify her orthotics today to remove the right scaphoid pad on the orthotic as this is causing too much arch support and actually supination of the foot.  We will see how she feels over the next week or 2 with this orthotic modification.  Given her improvement we will continue with a few sessions of extracorporeal shockwave therapy.  May use over-the-counter anti-inflammatories only as needed. F/u in 1 week.  Follow-up: Return in about 1 week (around 12/09/2023) for R-heel/PF .   Meds & Orders: No orders of the defined types were placed in this encounter.  No orders of the defined types were placed in this encounter.    Procedures: Procedure: ECSWT Indications:   Plantar fasciitis, right   Procedure Details Consent: Risks of procedure as well as the alternatives and risks of each were explained to the patient.  Verbal consent for procedure obtained. Time Out: Verified patient identification, verified procedure, site was marked, verified correct patient position. The area was cleaned with alcohol swab.     The right plantar fascia and the distal achilles was targeted for Extracorporeal shockwave therapy.    Preset: plantar fasciitis/Achillodynia Power Level: 100-110 mJ  Frequency: 12 Hz Impulse/cycles: 3200 (2000 PF; 1200 distal achilles/calc) Head size: Regular   Patient tolerated procedure well without immediate complications        Clinical History: No specialty comments available.  She reports that she has never smoked. She has never used smokeless tobacco. No results for input(s): "HGBA1C", "LABURIC" in the last 8760 hours.  Objective:   Vital Signs: LMP  (LMP Unknown)   Physical Exam  Gen: Well-appearing, in no acute distress; non-toxic CV: Well-perfused. Warm.  Resp: Breathing unlabored on room air; no wheezing. Psych: Fluid speech in conversation; appropriate affect; normal thought process Neuro: Sensation intact throughout. No gross coordination deficits.   Ortho Exam - Right foot: Improved tenderness over the plantar fascia insertion.  No redness swelling or effusion near.  -Gait analysis does show excessive supination of the right foot with her orthotics with scaphoid pad.  This was improved with removal of the scaphoid pad to a more neutral gait/stance.  Imaging: No results found.  Past Medical/Family/Surgical/Social History:  Medications & Allergies reviewed per EMR, new medications updated. Patient Active Problem List   Diagnosis Date Noted   Insomnia    OSA on CPAP 02/18/2022   Chronic migraine without aura without status migrainosus, not intractable 01/16/2022   Witnessed episode of apnea 09/05/2021   Worsening  headaches 09/05/2021   Morning headache 09/05/2021   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   Non-restorative sleep 09/05/2021   Plantar fasciitis of left foot 03/24/2018   Asthmatic bronchitis without complication 11/13/2014   Seasonal and perennial allergic rhinitis 11/13/2014   Past Medical History:  Diagnosis Date   Allergy    Anxiety    Asthma    allergic asthma due to allergies per pt   Depression    Fundic gland polyps of stomach, benign    GERD (gastroesophageal reflux disease)    Headache    Hiatal hernia    Hypercholesteremia    Insomnia    Osteopenia    Plantar fascia syndrome    Scoliosis    chronic pain   Sleep apnea    wears cpap   Family History  Problem Relation Age of Onset   Dementia Mother    Hypertension Mother    Glaucoma Mother    Heart disease Father    Rheum arthritis Father    Lupus Father    Aneurysm Father    CAD Father    Hypertension Father    Hyperlipidemia Father    Stroke Maternal Grandfather    Breast cancer Neg Hx    Colon cancer Neg Hx    Stomach cancer Neg Hx    Esophageal cancer Neg Hx    Rectal cancer Neg Hx    Migraines Neg Hx    Colon polyps Neg Hx    Past Surgical History:  Procedure Laterality Date   COLONOSCOPY     excision of morton's neuroma  1986   MYOMECTOMY  1996   NASAL SEPTOPLASTY W/ TURBINOPLASTY N/A 03/25/2022   Procedure: NASAL SEPTOPLASTY WITH TURBINATE REDUCTION;  Surgeon: Newman Pies, MD;  Location: [facility];  Service: ENT;  Laterality: N/A;   POLYPECTOMY     SIGMOIDOSCOPY     TONSILLECTOMY  1966   Social History   Occupational History   Occupation: non Ambulance person  Tobacco Use   Smoking status: Never   Smokeless tobacco: Never  Vaping Use   Vaping status: Never Used  Substance and Sexual Activity   Alcohol use: Not Currently   Drug use: No   Sexual activity: Yes    Partners: Male

## 2023-12-03 DIAGNOSIS — E785 Hyperlipidemia, unspecified: Secondary | ICD-10-CM | POA: Diagnosis not present

## 2023-12-03 DIAGNOSIS — E559 Vitamin D deficiency, unspecified: Secondary | ICD-10-CM | POA: Diagnosis not present

## 2023-12-08 ENCOUNTER — Encounter: Payer: Self-pay | Admitting: Sports Medicine

## 2023-12-08 ENCOUNTER — Ambulatory Visit: Payer: Medicare Other | Admitting: Sports Medicine

## 2023-12-08 DIAGNOSIS — G8929 Other chronic pain: Secondary | ICD-10-CM | POA: Diagnosis not present

## 2023-12-08 DIAGNOSIS — M25571 Pain in right ankle and joints of right foot: Secondary | ICD-10-CM

## 2023-12-08 DIAGNOSIS — M722 Plantar fascial fibromatosis: Secondary | ICD-10-CM | POA: Diagnosis not present

## 2023-12-08 DIAGNOSIS — M25561 Pain in right knee: Secondary | ICD-10-CM | POA: Diagnosis not present

## 2023-12-08 DIAGNOSIS — H53483 Generalized contraction of visual field, bilateral: Secondary | ICD-10-CM | POA: Diagnosis not present

## 2023-12-08 NOTE — Progress Notes (Signed)
Misty [name] - 72 y.o. female MRN [id number]  Date of birth: 1951-12-11  Office Visit Note: Visit Date: 12/08/2023 PCP: Misty Penna, MD Referred by: Misty Penna, MD  Subjective: Chief Complaint  Patient presents with   Right Heel - Follow-up   HPI: Misty [name] is a pleasant 72 y.o. female who presents today for follow-up of R-foot/plantar fasciitis.  Her heel/plantar fascia still feels markedly improved from before, less improvement with this most recent shockwave treatment but overall is improved. Notices a difference with her red orthotics versus green sports insoles. Does think she may need to replace her [employer] shoes as they are somewhat broken down.  Pertinent ROS were reviewed with the patient and found to be negative unless otherwise specified above in HPI.   Assessment & Plan: Visit Diagnoses:  1. Plantar fasciitis of right foot   2. Pain in right ankle and joints of right foot   3. Chronic pain of right knee    Plan: [name] is still made excellent improvement in her heel/plantar fascia pain with extracorporeal shockwave therapy, we did repeat this treatment today.  She will go into her cam walker boot just for today and tomorrow and then return to regular tennis shoes.  Did discuss her trialing different orthotics/insoles to see which are most comfortable for her.  She may need to update knees versus shoe wear given her breakdown.  She did mention some residual knee pain over the lateral side, if this is still bothering her and going for the would proceed likely with x-ray evaluation first.  She will follow-up in 1 week for repeat evaluation and treatment.  She may use topical medications and or Tylenol/Motrin only as needed.  Follow-up: F/u in 1-week (if knee is still bothering, consider XR)   Meds & Orders: No orders of the defined types were placed in this encounter.  No orders of the defined types were placed in this encounter.    Procedures: Procedure:  ECSWT Indications:  Plantar fasciitis, right   Procedure Details Consent: Risks of procedure as well as the alternatives and risks of each were explained to the patient.  Verbal consent for procedure obtained. Time Out: Verified patient identification, verified procedure, site was marked, verified correct patient position. The area was cleaned with alcohol swab.     The right plantar fascia was targeted for Extracorporeal shockwave therapy.    Preset: plantar fasciitis Power Level: 110 mJ  Frequency: 12 Hz Impulse/cycles: 3000 Head size: Regular   Patient tolerated procedure well without immediate complications      Clinical History: No specialty comments available.  She reports that she has never smoked. She has never used smokeless tobacco. No results for input(s): "HGBA1C", "LABURIC" in the last 8760 hours.  Objective:   Vital Signs: LMP  (LMP Unknown)   Physical Exam  Gen: Well-appearing, in no acute distress; non-toxic CV: Well-perfused. Warm.  Resp: Breathing unlabored on room air; no wheezing. Psych: Fluid speech in conversation; appropriate affect; normal thought process Neuro: Sensation intact throughout. No gross coordination deficits.   Ortho Exam - RLE: Very mild TTP with deep palpation over the medial aspect of the plantar fascia band.  No swelling or effusion.  Negative calcaneal heel squeeze.  - R knee: No swelling or effusion.  There is patellar crepitus on the lateral side with flexion and extension.  Imaging: No results found.  Past Medical/Family/Surgical/Social History: Medications & Allergies reviewed per EMR, new medications updated. Patient Active Problem List  Diagnosis Date Noted   Insomnia    OSA on CPAP 02/18/2022   Chronic migraine without aura without status migrainosus, not intractable 01/16/2022   Witnessed episode of apnea 09/05/2021   Worsening headaches 09/05/2021   Morning headache 09/05/2021   Behavioral insomnia of childhood  09/05/2021   Non-restorative sleep 09/05/2021   Plantar fasciitis of left foot 03/24/2018   Asthmatic bronchitis without complication 11/13/2014   Seasonal and perennial allergic rhinitis 11/13/2014   Past Medical History:  Diagnosis Date   Allergy    Anxiety    Asthma    allergic asthma due to allergies per pt   Depression    Fundic gland polyps of stomach, benign    GERD (gastroesophageal reflux disease)    Headache    Hiatal hernia    Hypercholesteremia    Insomnia    Osteopenia    Plantar fascia syndrome    Scoliosis    chronic pain   Sleep apnea    wears cpap   Family History  Problem Relation Age of Onset   Dementia Mother    Hypertension Mother    Glaucoma Mother    Heart disease Father    Rheum arthritis Father    Lupus Father    Aneurysm Father    CAD Father    Hypertension Father    Hyperlipidemia Father    Stroke Maternal Grandfather    Breast cancer Neg Hx    Colon cancer Neg Hx    Stomach cancer Neg Hx    Esophageal cancer Neg Hx    Rectal cancer Neg Hx    Migraines Neg Hx    Colon polyps Neg Hx    Past Surgical History:  Procedure Laterality Date   COLONOSCOPY     excision of morton's neuroma  1986   MYOMECTOMY  1996   NASAL SEPTOPLASTY W/ TURBINOPLASTY N/A 03/25/2022   Procedure: NASAL SEPTOPLASTY WITH TURBINATE REDUCTION;  Surgeon: Newman Pies, MD;  Location: [facility];  Service: ENT;  Laterality: N/A;   POLYPECTOMY     SIGMOIDOSCOPY     TONSILLECTOMY  1966   Social History   Occupational History   Occupation: non Ambulance person  Tobacco Use   Smoking status: Never   Smokeless tobacco: Never  Vaping Use   Vaping status: Never Used  Substance and Sexual Activity   Alcohol use: Not Currently   Drug use: No   Sexual activity: Yes    Partners: Male

## 2023-12-08 NOTE — Progress Notes (Signed)
Patient says that she feels about the same as last visit, but still considerably better than initially. She says that it has changed a bit as she has tried wearing some of her other shoes. Patient says that her knee is still sore, although now it seems to be more on the side than in the front.

## 2023-12-09 DIAGNOSIS — Z0000 Encounter for general adult medical examination without abnormal findings: Secondary | ICD-10-CM | POA: Diagnosis not present

## 2023-12-09 DIAGNOSIS — Z1331 Encounter for screening for depression: Secondary | ICD-10-CM | POA: Diagnosis not present

## 2023-12-09 DIAGNOSIS — R82998 Other abnormal findings in urine: Secondary | ICD-10-CM | POA: Diagnosis not present

## 2023-12-09 DIAGNOSIS — G43909 Migraine, unspecified, not intractable, without status migrainosus: Secondary | ICD-10-CM | POA: Diagnosis not present

## 2023-12-09 DIAGNOSIS — Z1339 Encounter for screening examination for other mental health and behavioral disorders: Secondary | ICD-10-CM | POA: Diagnosis not present

## 2023-12-15 ENCOUNTER — Ambulatory Visit: Payer: Medicare Other | Admitting: Sports Medicine

## 2023-12-15 ENCOUNTER — Encounter: Payer: Self-pay | Admitting: Sports Medicine

## 2023-12-15 ENCOUNTER — Other Ambulatory Visit (INDEPENDENT_AMBULATORY_CARE_PROVIDER_SITE_OTHER): Payer: Medicare Other

## 2023-12-15 DIAGNOSIS — G8929 Other chronic pain: Secondary | ICD-10-CM

## 2023-12-15 DIAGNOSIS — M722 Plantar fascial fibromatosis: Secondary | ICD-10-CM | POA: Diagnosis not present

## 2023-12-15 DIAGNOSIS — M25561 Pain in right knee: Secondary | ICD-10-CM

## 2023-12-15 DIAGNOSIS — R269 Unspecified abnormalities of gait and mobility: Secondary | ICD-10-CM

## 2023-12-15 NOTE — Progress Notes (Signed)
Patient says that she is feeling a bit better. She says that she has been wearing boots and cannot wear the same insoles in those, but has purchased others that seem to work well. She says that she really only notices it when barefoot or walking for extended time. Patient says that her knee does feel a bit better.

## 2023-12-15 NOTE — Progress Notes (Addendum)
Misty [name] - 72 y.o. female MRN [id number]  Date of birth: April 14, 1951  Office Visit Note: Visit Date: 12/15/2023 PCP: Alysia Penna, MD Referred by: Alysia Penna, MD  Subjective: Chief Complaint  Patient presents with   Right Heel - Follow-up   HPI: Misty [name] is a pleasant 71 y.o. female who presents today for follow-up of right foot plantar fasciitis as well as chronic right knee pain.  We have performed 3 sessions of extracorporeal shockwave therapy and this is definitely helping her heel and foot.  She only has pain when she is walking barefoot, feels good and her insoles as well as over-the-counter insoles she bought for her boots and other low-profile shoe wear.  Overall her right knee feels better than when it did when she fell onto it, she does not have the soft tissue swelling she had before but still some residual pain over the lateral joint line.  Pertinent ROS were reviewed with the patient and found to be negative unless otherwise specified above in HPI.   Assessment & Plan: Visit Diagnoses:  1. Chronic pain of right knee   2. Plantar fasciitis of right foot   3. Gait abnormality    Plan: Misty [name] is making improvements with her heel and plantar fascia pain with extracorporeal shockwave therapy, we did repeat a treatment session today.  She will go into her cam walker boot only for today and tomorrow and then may return to regular tennis shoes.  She will wear these as well as her orthotics to help support her feet and help with her gait. She has had residual lateral knee pain after a fall directly onto the knees with a previous infrapatellar bursitis.  This has resolved but she still continues with some pain over the lateral side.  X-rays are reassuring against any significant arthritic change or acute bony fracture.  Recommended topical medications, she may use Tylenol or Motrin only as needed.  Will hold off on further intervention of the knee given her benign  x-rays and her overall improving pain.  We will see her back over the next 2-3 weeks to reevaluate the heel/plantar fascia and could consider a few additional treatments of ESWT.  Additional treatment considerations: Very low-dose prednisone for 5 to 7 days - hold for now  Follow-up: Return for f/u in 2-3 weeks for heel, R-knee.   Meds & Orders: No orders of the defined types were placed in this encounter.   Orders Placed This Encounter  Procedures   XR Knee Complete 4 Views Right     Procedures: Procedure: ECSWT Indications:  Plantar fasciitis, right   Procedure Details Consent: Risks of procedure as well as the alternatives and risks of each were explained to the patient.  Verbal consent for procedure obtained. Time Out: Verified patient identification, verified procedure, site was marked, verified correct patient position. The area was cleaned with alcohol swab.     The right plantar fascia was targeted for Extracorporeal shockwave therapy.    Preset: plantar fasciitis Power Level: 110-120 mJ Frequency: 12 Hz Impulse/cycles: 3200 Head size: Regular   Patient tolerated procedure well without immediate complication      Clinical History: No specialty comments available.  She reports that she has never smoked. She has never used smokeless tobacco. No results for input(s): "HGBA1C", "LABURIC" in the last 8760 hours.  Objective:   Vital Signs: LMP  (LMP Unknown)   Physical Exam  Gen: Well-appearing, in no acute distress; non-toxic CV: Well-perfused.  Warm.  Resp: Breathing unlabored on room air; no wheezing. Psych: Fluid speech in conversation; appropriate affect; normal thought process Neuro: Sensation intact throughout. No gross coordination deficits.   Ortho Exam - R-foot: No tenderness over the plantar fascia or the calcaneus.  Negative heel squeeze.  Good range of motion with dorsiflexion and plantarflexion.  - Right knee: No redness swelling or effusion.  There is  mild TTP over the lateral joint line and the lateral aspect of the patella.  Full range of motion from 0-135 degrees.  No varus or valgus instability.  Imaging: XR Knee Complete 4 Views Right Result Date: 12/15/2023 4 views of the right knee including standing AP, Rosenberg, lateral and sunrise view were ordered and reviewed by myself today.  X-ray shows very mild, age-appropriate medial tibiofemoral joint space narrowing, mild patellofemoral arthritis but relatively well-preserved joint spaces tricompartmentally.  There is a posterior fabella.  No acute fracture or otherwise bony abnormality noted.   Past Medical/Family/Surgical/Social History: Medications & Allergies reviewed per EMR, new medications updated. 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   Non-restorative sleep 09/05/2021   Plantar fasciitis of left foot 03/24/2018   Asthmatic bronchitis without complication 11/13/2014   Seasonal and perennial allergic rhinitis 11/13/2014   Past Medical History:  Diagnosis Date   Allergy    Anxiety    Asthma    allergic asthma due to allergies per pt   Depression    Fundic gland polyps of stomach, benign    GERD (gastroesophageal reflux disease)    Headache    Hiatal hernia    Hypercholesteremia    Insomnia    Osteopenia    Plantar fascia syndrome    Scoliosis    chronic pain   Sleep apnea    wears cpap   Family History  Problem Relation Age of Onset   Dementia Mother    Hypertension Mother    Glaucoma Mother    Heart disease Father    Rheum arthritis Father    Lupus Father    Aneurysm Father    CAD Father    Hypertension Father    Hyperlipidemia Father    Stroke Maternal Grandfather     Breast cancer Neg Hx    Colon cancer Neg Hx    Stomach cancer Neg Hx    Esophageal cancer Neg Hx    Rectal cancer Neg Hx    Migraines Neg Hx    Colon polyps Neg Hx    Past Surgical History:  Procedure Laterality Date   COLONOSCOPY     excision of morton's neuroma  1986   MYOMECTOMY  1996   NASAL SEPTOPLASTY W/ TURBINOPLASTY N/A 03/25/2022   Procedure: NASAL SEPTOPLASTY WITH TURBINATE REDUCTION;  Surgeon: Newman Pies, MD;  Location: [facility];  Service: ENT;  Laterality: N/A;   POLYPECTOMY     SIGMOIDOSCOPY     TONSILLECTOMY  1966   Social History   Occupational History   Occupation: non Ambulance person  Tobacco Use   Smoking status: Never   Smokeless tobacco: Never  Vaping Use   Vaping status: Never Used  Substance and Sexual Activity   Alcohol use: Not Currently   Drug use: No  Sexual activity: Yes    Partners: Male   I spent 33 minutes in the care of the patient today including face-to-face time, preparation to see the patient, as well as review of previous foot imaging, treatment for ECSWT, orthotic and shoewear modification guidance for the above diagnoses.   Madelyn Brunner, DO Primary Care Sports Medicine Physician  [facility] - Orthoped[facility]  This note was dictated using Dragon naturally speaking software and may contain errors in syntax, spelling, or content which have not been identified prior to signing this note.

## 2023-12-22 ENCOUNTER — Ambulatory Visit: Payer: Medicare Other | Admitting: Sports Medicine

## 2023-12-22 ENCOUNTER — Encounter: Payer: Self-pay | Admitting: Sports Medicine

## 2023-12-22 DIAGNOSIS — M25561 Pain in right knee: Secondary | ICD-10-CM | POA: Diagnosis not present

## 2023-12-22 DIAGNOSIS — M722 Plantar fascial fibromatosis: Secondary | ICD-10-CM

## 2023-12-22 DIAGNOSIS — G8929 Other chronic pain: Secondary | ICD-10-CM

## 2023-12-22 NOTE — Progress Notes (Signed)
Misty [name] - 72 y.o. female MRN [id number]  Date of birth: Oct 02, 1951  Office Visit Note: Visit Date: 12/22/2023 PCP: Alysia Penna, MD Referred by: Alysia Penna, MD  Subjective: Chief Complaint  Patient presents with   Right Heel - Follow-up   HPI: Misty [name] is a pleasant 72 y.o. female who presents today for follow-up of right foot plantar fasciitis.  We have performed 4 sessions of extracorporeal shockwave therapy and she states overall her heel/plantar fascia is largely improved.  She barely has any discomfort at this point.  She does notice certain shoes or insoles feel better for her feet and the heel itself.  Her right knee is also feeling better, we did review her x-rays today.  Pertinent ROS were reviewed with the patient and found to be negative unless otherwise specified above in HPI.   Assessment & Plan: Visit Diagnoses:  1. Plantar fasciitis of right foot   2. Chronic pain of right knee    Plan: Impression is nearly resolved plantar fasciitis of the right foot which has made excellent progress with extracorporeal shockwave therapy and shoe modification.  We did repeat a final treatment of ESWT today, she will go back into her cam walker boot only for today and tomorrow in then may resume her normal shoewear.  Given her near pain resolution, we will take a treatment break for at least 4-6 weeks and see what sort of cumulative benefit she has going forward.  For this and her knee, okay for topical medications and/or Tylenol/Motrin as needed only.  Did review her x-rays today in the room which shows mild patellofemoral arthritis.  She will follow-up with me as needed.  Follow-up: Return if symptoms worsen or fail to improve.   Meds & Orders: No orders of the defined types were placed in this encounter.  No orders of the defined types were placed in this encounter.    Procedures: Procedure: ECSWT Indications:  Plantar fasciitis, right   Procedure  Details Consent: Risks of procedure as well as the alternatives and risks of each were explained to the patient.  Verbal consent for procedure obtained. Time Out: Verified patient identification, verified procedure, site was marked, verified correct patient position. The area was cleaned with alcohol swab.     The right plantar fascia was targeted for Extracorporeal shockwave therapy.    Preset: plantar fasciitis Power Level: 120 mJ  Frequency: 12 Hz Impulse/cycles: 3400 Head size: Regular   Patient tolerated procedure well without immediate complication       Clinical History: No specialty comments available.  She reports that she has never smoked. She has never used smokeless tobacco. No results for input(s): "HGBA1C", "LABURIC" in the last 8760 hours.  Objective:   Vital Signs: LMP  (LMP Unknown)   Physical Exam  Gen: Well-appearing, in no acute distress; non-toxic CV: Well-perfused. Warm.  Resp: Breathing unlabored on room air; no wheezing. Psych: Fluid speech in conversation; appropriate affect; normal thought process Neuro: Sensation intact throughout. No gross coordination deficits.   Ortho Exam - Right foot: There is no redness or swelling, there is no tenderness over the plantar fascia, negative heel squeeze.  There is full range of motion about the ankle with good strength and preservation of motion.  Imaging: No results found.  Past Medical/Family/Surgical/Social History: Medications & Allergies reviewed per EMR, new medications updated. Patient Active Problem List   Diagnosis Date Noted   Insomnia    OSA on CPAP 02/18/2022  Chronic migraine without aura without status migrainosus, not intractable 01/16/2022   Witnessed episode of apnea 09/05/2021   Worsening headaches 09/05/2021   Morning headache 09/05/2021   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   Non-restorative sleep 09/05/2021   Plantar fasciitis of left foot 03/24/2018   Asthmatic bronchitis  without complication 11/13/2014   Seasonal and perennial allergic rhinitis 11/13/2014   Past Medical History:  Diagnosis Date   Allergy    Anxiety    Asthma    allergic asthma due to allergies per pt   Depression    Fundic gland polyps of stomach, benign    GERD (gastroesophageal reflux disease)    Headache    Hiatal hernia    Hypercholesteremia    Insomnia    Osteopenia    Plantar fascia syndrome    Scoliosis    chronic pain   Sleep apnea    wears cpap   Family History  Problem Relation Age of Onset   Dementia Mother    Hypertension Mother    Glaucoma Mother    Heart disease Father    Rheum arthritis Father    Lupus Father    Aneurysm Father    CAD Father    Hypertension Father    Hyperlipidemia Father    Stroke Maternal Grandfather    Breast cancer Neg Hx    Colon cancer Neg Hx    Stomach cancer Neg Hx    Esophageal cancer Neg Hx    Rectal cancer Neg Hx    Migraines Neg Hx    Colon polyps Neg Hx    Past Surgical History:  Procedure Laterality Date   COLONOSCOPY     excision of morton's neuroma  1986   MYOMECTOMY  1996   NASAL SEPTOPLASTY W/ TURBINOPLASTY N/A 03/25/2022   Procedure: NASAL SEPTOPLASTY WITH TURBINATE REDUCTION;  Surgeon: Newman Pies, MD;  Location: [facility];  Service: ENT;  Laterality: N/A;   POLYPECTOMY     SIGMOIDOSCOPY     TONSILLECTOMY  1966   Social History   Occupational History   Occupation: non Ambulance person  Tobacco Use   Smoking status: Never   Smokeless tobacco: Never  Vaping Use   Vaping status: Never Used  Substance and Sexual Activity   Alcohol use: Not Currently   Drug use: No   Sexual activity: Yes    Partners: Male

## 2023-12-22 NOTE — Progress Notes (Signed)
Patient says that she is feeling much better. She says her foot feels almost completely better and her knee is also improving, just a bit sore.

## 2023-12-29 DIAGNOSIS — G4701 Insomnia due to medical condition: Secondary | ICD-10-CM | POA: Diagnosis not present

## 2023-12-29 DIAGNOSIS — R2689 Other abnormalities of gait and mobility: Secondary | ICD-10-CM | POA: Diagnosis not present

## 2023-12-29 DIAGNOSIS — G43719 Chronic migraine without aura, intractable, without status migrainosus: Secondary | ICD-10-CM | POA: Diagnosis not present

## 2023-12-29 DIAGNOSIS — R030 Elevated blood-pressure reading, without diagnosis of hypertension: Secondary | ICD-10-CM | POA: Diagnosis not present

## 2024-01-12 DIAGNOSIS — L570 Actinic keratosis: Secondary | ICD-10-CM | POA: Diagnosis not present

## 2024-01-12 DIAGNOSIS — L2989 Other pruritus: Secondary | ICD-10-CM | POA: Diagnosis not present

## 2024-01-12 DIAGNOSIS — Z85828 Personal history of other malignant neoplasm of skin: Secondary | ICD-10-CM | POA: Diagnosis not present

## 2024-01-14 ENCOUNTER — Ambulatory Visit: Payer: Medicare Other | Admitting: Family Medicine

## 2024-01-14 VITALS — BP 143/87 | Ht 66.5 in | Wt 178.0 lb

## 2024-01-14 DIAGNOSIS — M722 Plantar fascial fibromatosis: Secondary | ICD-10-CM | POA: Diagnosis not present

## 2024-01-14 NOTE — Progress Notes (Signed)
PCP: Alysia Penna, MD  Chief Complaint: bilateral plantar fascia Subjective:   HPI: Patient is a 73 y.o. female here for bilateral plantar fasciitis.  Patient recently underwent shockwave therapy and had 5 sessions and notes significant improvement after the sessions.  Patient states that she is anywhere between 75 to 85% better.  Patient has had personal orthotics in the past and states that over the past few months those have been bothering her more.  Patient states that she is pain free at the moment when using over-the-counter orthotics.  Patient was advised to come here to see if she needs new orthotics.  Patient does have an upcoming trip in about 2 weeks and does plan on doing lots of walking.  Patient otherwise has no other concerns at this time.  Did note green sports insoles in past have felt better than custom orthotics.   Past Medical History:  Diagnosis Date   Allergy    Anxiety    Asthma    allergic asthma due to allergies per pt   Depression    Fundic gland polyps of stomach, benign    GERD (gastroesophageal reflux disease)    Headache    Hiatal hernia    Hypercholesteremia    Insomnia    Osteopenia    Plantar fascia syndrome    Scoliosis    chronic pain   Sleep apnea    wears cpap    Current Outpatient Medications on File Prior to Visit  Medication Sig Dispense Refill   albuterol (VENTOLIN HFA) 108 (90 Base) MCG/ACT inhaler INHALE 1 TO 2 PUFFS BY MOUTH EVERY 4 TO 6 HOURS AS NEEDED     Cholecalciferol (VITAMIN D) 125 MCG (5000 UT) CAPS Take 5,000 Units by mouth daily.     clonazePAM (KLONOPIN) 0.5 MG tablet 1 to 3 tabs for sleep as needed 270 tablet 1   esomeprazole (NEXIUM) 40 MG capsule Take 1 capsule (40 mg total) by mouth 2 (two) times daily before a meal. 180 capsule 3   ezetimibe (ZETIA) 10 MG tablet Take 10 mg by mouth daily.     hydrocortisone 2.5 % cream Apply topically.     ketoconazole (NIZORAL) 2 % cream SMARTSIG:1 Topical Daily PRN     Magnesium  Gluconate 550 MG TABS Take by mouth.     nortriptyline (PAMELOR) 50 MG capsule Take 50 mg by mouth at bedtime.     rizatriptan (MAXALT-MLT) 10 MG disintegrating tablet Take 1 tablet (10 mg total) by mouth as needed for migraine. May repeat in 2 hours if needed, max 2 tabs / 24 hours 27 tablet 3   verapamil (VERELAN PM) 120 MG 24 hr capsule Take 120 mg by mouth at bedtime.     vitamin B-12 (CYANOCOBALAMIN) 500 MCG tablet Take by mouth.     No current facility-administered medications on file prior to visit.    Past Surgical History:  Procedure Laterality Date   COLONOSCOPY     excision of morton's neuroma  1986   MYOMECTOMY  1996   NASAL SEPTOPLASTY W/ TURBINOPLASTY N/A 03/25/2022   Procedure: NASAL SEPTOPLASTY WITH TURBINATE REDUCTION;  Surgeon: Newman Pies, MD;  Location: [facility];  Service: ENT;  Laterality: N/A;   POLYPECTOMY     SIGMOIDOSCOPY     TONSILLECTOMY  1966    Allergies  Allergen Reactions   Dust Mite Extract    Latex Hives   Macrobid  [Nitrofurantoin Macrocrystal]    Molds & Smuts  BP (!) 143/87   Ht 5' 6.5" (1.689 m)   Wt 178 lb (80.7 kg)   LMP  (LMP Unknown)   BMI 28.30 kg/m       No data to display              No data to display              Objective:  Physical Exam:  Gen: NAD, comfortable in exam room  Bilateral foot: Inspection reveals pes planus of the bilateral feet with the left being worse than the right.  There is still notable arch as well as the transverse arch.  There is no tenderness over the plantar fascia over the midfoot or over the heel.  FROM ankles.  Calcaneal squeeze test is negative. Gait does show preference for supination bilaterally.   Assessment & Plan:  1. 1. Bilateral plantar fasciitis (Primary) -Patient with bilateral plantar fasciitis with significant improvement after shockwave therapy.  Discussed with patient that at this time she has had significant benefit with shockwave as well as her  current over-the-counter orthotics, there is no need to try new orthotics as they may end up not being as effective. -Did discuss with patient that if anytime over the next 2 to 3 weeks she does feel like her current orthotics are not effective, we can go ahead and give her a new pair of the sport insoles as custom orthotics may not benefit her.  Patient does have a current pair of custom orthotics which are still usable but have been giving her trouble likely due to the fact that she still has significant amount of arch.     Brenton Grills MD, PGY-4  Sports Medicine Fellow [facility]

## 2024-01-27 DIAGNOSIS — K080 Exfoliation of teeth due to systemic causes: Secondary | ICD-10-CM | POA: Diagnosis not present

## 2024-02-24 DIAGNOSIS — L308 Other specified dermatitis: Secondary | ICD-10-CM | POA: Diagnosis not present

## 2024-02-24 DIAGNOSIS — Z85828 Personal history of other malignant neoplasm of skin: Secondary | ICD-10-CM | POA: Diagnosis not present

## 2024-02-24 DIAGNOSIS — L814 Other melanin hyperpigmentation: Secondary | ICD-10-CM | POA: Diagnosis not present

## 2024-02-24 DIAGNOSIS — D485 Neoplasm of uncertain behavior of skin: Secondary | ICD-10-CM | POA: Diagnosis not present

## 2024-04-15 DIAGNOSIS — H02834 Dermatochalasis of left upper eyelid: Secondary | ICD-10-CM | POA: Diagnosis not present

## 2024-04-15 DIAGNOSIS — H02831 Dermatochalasis of right upper eyelid: Secondary | ICD-10-CM | POA: Diagnosis not present

## 2024-04-15 DIAGNOSIS — H57813 Brow ptosis, bilateral: Secondary | ICD-10-CM | POA: Diagnosis not present

## 2024-04-22 DIAGNOSIS — H5213 Myopia, bilateral: Secondary | ICD-10-CM | POA: Diagnosis not present

## 2024-04-24 NOTE — Progress Notes (Unsigned)
 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  Insomnia, complicated by headache,  Allergic Rhinitis, Asthmatic Bronchitis, Migraine, GERD,  -clonazepam  0.5, ventolin hfa, nortriptyline,  CPAPauto 6-16/ [facility]  new Nov  2022   ([employer]) Download-compliance -used every night, pressure around 6.5, AHI < 1.0/ hr Body weight today-.180 lbs She reports sleeping much better with CPAP. Clonazepam  using 1.25 x 0.5 mg tabs every night at bedtime and this is working well. She has changed from Neurology to us  for CPAP management. She is frustrated with [facility] DME communication and service and asks change to a different DME. Asthma is mild, intermittent with no recent need for her rescue inhaler.  Fall is usually her worst season.  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    ROS-see HPI  + = positive Constitutional:    weight loss, night sweats, fevers, chills, +fatigue, lassitude. HEENT:    +headaches, difficulty swallowing, tooth/dental problems, sore throat,       sneezing, itching, ear ache, nasal congestion, post nasal drip, snoring CV:    chest pain, orthopnea, PND, swelling in lower extremities, anasarca,                                   dizziness, palpitations Resp:   shortness of  breath with exertion or at rest.                productive cough,   non-productive cough, coughing up of blood.              change in color of mucus.  wheezing.   Skin:    rash or lesions. GI:  No-   heartburn, indigestion, abdominal pain, nausea, vomiting, diarrhea,                 change in bowel habits, loss of appetite GU: dysuria, change in color of urine, no urgency or frequency.   flank pain. MS:   joint pain, stiffness, decreased range of motion, back pain. Neuro-     nothing unusual Psych:  change in mood or affect.  depression or anxiety.   memory loss.  OBJ- Physical Exam General- Alert, Oriented, Affect-briefly tearful+, Distress- none acute Skin- rash-none, lesions- none, excoriation- none Lymphadenopathy- none Head- atraumatic            Eyes- Gross vision intact, PERRLA, conjunctivae and secretions clear            Ears- Hearing, canals-normal            Nose- Clear, no-Septal dev, mucus, polyps, erosion, perforation             Throat- Mallampati II thin, posteriorly [laced soft palate, mucosa clear , drainage- none, tonsils- atrophic Neck- flexible , trachea midline, no stridor , thyroid  nl, carotid no bruit  Chest - symmetrical excursion , unlabored           Heart/CV- RRR , no murmur , no gallop  , no rub, nl s1 s2                           - JVD- none , edema- none, stasis changes- none, varices- none           Lung- clear to P&A, wheeze- none, cough- none , dullness-none, rub- none           Chest wall-  Abd-  Br/ Gen/ Rectal- Not done, not indicated Extrem- cyanosis- none, clubbing, none, atrophy- none, strength- nl Neuro- grossly intact to observation

## 2024-04-26 ENCOUNTER — Ambulatory Visit: Payer: Medicare Other | Admitting: Internal Medicine

## 2024-04-26 ENCOUNTER — Encounter: Payer: Self-pay | Admitting: Internal Medicine

## 2024-04-26 VITALS — BP 128/76 | HR 89 | Temp 98.0°F | Ht 66.5 in | Wt 189.4 lb

## 2024-04-26 DIAGNOSIS — G4733 Obstructive sleep apnea (adult) (pediatric): Secondary | ICD-10-CM | POA: Diagnosis not present

## 2024-04-26 MED ORDER — CLONAZEPAM 0.5 MG PO TABS: ORAL_TABLET | ORAL | 1 refills | Status: DC

## 2024-04-26 NOTE — Patient Instructions (Signed)
 We will reflll Albuterol rescue inhaler in the future if needed  We can continue CPAP at current settings  Clonazepam  refill sent  Please call if we can help

## 2024-05-07 ENCOUNTER — Encounter: Payer: Self-pay | Admitting: Internal Medicine

## 2024-05-07 NOTE — Telephone Encounter (Signed)
**Note De-identified  [place] Obfuscation** Please advise 

## 2024-05-10 ENCOUNTER — Encounter: Payer: Self-pay | Admitting: Family Medicine

## 2024-05-18 ENCOUNTER — Ambulatory Visit: Payer: Medicare Other | Admitting: Internal Medicine

## 2024-05-25 ENCOUNTER — Other Ambulatory Visit: Payer: Self-pay | Admitting: Internal Medicine

## 2024-05-26 ENCOUNTER — Other Ambulatory Visit: Payer: Self-pay

## 2024-05-26 DIAGNOSIS — G4733 Obstructive sleep apnea (adult) (pediatric): Secondary | ICD-10-CM

## 2024-05-26 NOTE — Telephone Encounter (Signed)
 Order- DME [facility]. Patient is compliant and benefits from CPAP Please reactivate [employer] or contact her to provide an SD card. Thank you.

## 2024-05-27 ENCOUNTER — Telehealth: Payer: Self-pay | Admitting: Internal Medicine

## 2024-05-27 NOTE — Telephone Encounter (Signed)
*  Caution - External email - see footer for warnings* Per Brad at [facility]-  I called Mitch and gave him details from our call. He is going to reach out to the [employer] team and see what he can do. Hopefully, we can get whom ever needs access what's needed so this is an easier process for you all. In between time feel free to cone message, email or call me for assistance with the needed downloads and I will get you copies of what's needed where and when I can.   Just an FYI

## 2024-05-28 NOTE — Telephone Encounter (Signed)
 Yes I just messaged him asking him.

## 2024-05-28 NOTE — Telephone Encounter (Signed)
 Thank you.  Can you let Rodman Clam know that I really need access to [employer].  Try to get me set up so I can get access to these patients accounts.  Thank you.

## 2024-06-01 DIAGNOSIS — Z01419 Encounter for gynecological examination (general) (routine) without abnormal findings: Secondary | ICD-10-CM | POA: Diagnosis not present

## 2024-06-01 DIAGNOSIS — Z6831 Body mass index (BMI) 31.0-31.9, adult: Secondary | ICD-10-CM | POA: Diagnosis not present

## 2024-06-01 DIAGNOSIS — M8588 Other specified disorders of bone density and structure, other site: Secondary | ICD-10-CM | POA: Diagnosis not present

## 2024-06-01 DIAGNOSIS — Z1231 Encounter for screening mammogram for malignant neoplasm of breast: Secondary | ICD-10-CM | POA: Diagnosis not present

## 2024-06-01 LAB — HM DEXA SCAN

## 2024-06-10 ENCOUNTER — Telehealth: Payer: Self-pay

## 2024-06-10 ENCOUNTER — Other Ambulatory Visit (HOSPITAL_COMMUNITY): Payer: Self-pay

## 2024-06-10 NOTE — Telephone Encounter (Signed)
 Pharmacy Patient Advocate Encounter   Received notification from Patient Pharmacy that prior authorization for Esomeprazole  Mag DR Caps 52M is required/requested.   Insurance verification completed.   The patient is insured through [employer] .   Per test claim: Patient has not been seen since 2023 and requires an office visit for updated documentation

## 2024-06-10 NOTE — Telephone Encounter (Signed)
 The pt has been advised and states she will call back to make appt as soon as she is back in town.  She states she has enough medication to last until she can be seen.

## 2024-06-25 DIAGNOSIS — R051 Acute cough: Secondary | ICD-10-CM | POA: Diagnosis not present

## 2024-06-25 DIAGNOSIS — J069 Acute upper respiratory infection, unspecified: Secondary | ICD-10-CM | POA: Diagnosis not present

## 2024-07-06 DIAGNOSIS — G43719 Chronic migraine without aura, intractable, without status migrainosus: Secondary | ICD-10-CM | POA: Diagnosis not present

## 2024-07-06 DIAGNOSIS — R030 Elevated blood-pressure reading, without diagnosis of hypertension: Secondary | ICD-10-CM | POA: Diagnosis not present

## 2024-07-06 DIAGNOSIS — G4701 Insomnia due to medical condition: Secondary | ICD-10-CM | POA: Diagnosis not present

## 2024-07-06 DIAGNOSIS — M5417 Radiculopathy, lumbosacral region: Secondary | ICD-10-CM | POA: Diagnosis not present

## 2024-07-09 DIAGNOSIS — J4520 Mild intermittent asthma, uncomplicated: Secondary | ICD-10-CM | POA: Diagnosis not present

## 2024-07-09 DIAGNOSIS — R058 Other specified cough: Secondary | ICD-10-CM | POA: Diagnosis not present

## 2024-07-20 DIAGNOSIS — J9801 Acute bronchospasm: Secondary | ICD-10-CM | POA: Diagnosis not present

## 2024-07-20 DIAGNOSIS — K080 Exfoliation of teeth due to systemic causes: Secondary | ICD-10-CM | POA: Diagnosis not present

## 2024-07-20 DIAGNOSIS — R058 Other specified cough: Secondary | ICD-10-CM | POA: Diagnosis not present

## 2024-07-20 DIAGNOSIS — G4733 Obstructive sleep apnea (adult) (pediatric): Secondary | ICD-10-CM | POA: Diagnosis not present

## 2024-07-30 NOTE — Procedures (Signed)
Mask fit

## 2024-10-21 NOTE — Progress Notes (Unsigned)
   LILLETTE Ileana Collet, PhD, LAT, ATC acting as a scribe for Artist Lloyd, MD.  Misty [name] is a 73 y.o. female who presents to [facility] today for osteoporosis management. Family hx of breast cancer and osteoporosis.  DEXA scan (date, T-score): 06/01/24: Spine= -1.5, L-FN= -2.2, R-FN= -1.8, L-forearm= -1.4 Prior treatment: none History of Hip, Spine, or Wrist Fx: yes- 2018 patellar fx Heart disease or stroke: no Cancer: none Kidney Disease: no Gastric/Peptic Ulcer: no Gastric bypass surgery: no Severe GERD: yes- Nexium   Hx of seizures: no Age at Menopause: 73y/o Calcium intake: none Vitamin D intake: yes- 5000iU Hormone replacement therapy: none Smoking history: never smoked Alcohol : none Exercise: yes- walking 2-3 x wk Major dental work in past year: no Parents with hip/spine fracture: yes- mom Height loss: 66.75 to 65   Pertinent review of systems: No fevers or chills  Relevant historical information: History of a patellar fracture after landing on a trailer hitch in 2018.   Exam:  BP (!) 154/88   Pulse 86   Ht 5' 6.5 (1.689 m)   Wt 181 lb (82.1 kg)   LMP  (LMP Unknown)   SpO2 97%   BMI 28.78 kg/m  General: Well Developed, well nourished, and in no acute distress.   MSK: Normal gait    Lab and Radiology Results No results found for this or any previous visit (from the past 72 hours). No results found.     Assessment and Plan: 73 y.o. female with osteoporosis with T-score -2.2.  She does have a history of a fracture.  It is hard to know if this is a true fragility fracture or not.  We talked about options.  She generally is not very interested in medications for this if possible.  Plan to maximize conservative management with weightbearing exercise and resistance training.  Vitamin D is scheduled to be checked in January.  She is taking vitamin D supplementation.  Recommend pursuing this treatment option of weightbearing  exercise with adequate vitamin D and reassessing bone density in 2 years.  If needed certainly there are medications to use.   PDMP not reviewed this encounter. No orders of the defined types were placed in this encounter.  No orders of the defined types were placed in this encounter.    Discussed warning signs or symptoms. Please see discharge instructions. Patient expresses understanding.   The above documentation has been reviewed and is accurate and complete Artist Lloyd, M.D.

## 2024-10-22 ENCOUNTER — Ambulatory Visit: Admitting: Family Medicine

## 2024-10-22 VITALS — BP 154/88 | HR 86 | Ht 66.5 in | Wt 181.0 lb

## 2024-10-22 DIAGNOSIS — M8580 Other specified disorders of bone density and structure, unspecified site: Secondary | ICD-10-CM

## 2024-10-22 NOTE — Patient Instructions (Addendum)
 Thank you for coming in today.   Advise working out with a Systems analyst at [employer]  Continue Vitamin D  Let me know what your labs show  Re-check bone density in 2 years

## 2024-10-26 ENCOUNTER — Encounter: Payer: Self-pay | Admitting: Family Medicine

## 2024-10-28 ENCOUNTER — Encounter: Payer: Self-pay | Admitting: Internal Medicine

## 2024-10-28 NOTE — Telephone Encounter (Signed)
**Note De-identified  [place] Obfuscation** Please advise 

## 2024-11-01 ENCOUNTER — Encounter: Payer: Self-pay | Admitting: Radiology

## 2024-11-09 DIAGNOSIS — H57813 Brow ptosis, bilateral: Secondary | ICD-10-CM | POA: Diagnosis not present

## 2024-11-22 MED ORDER — CLONAZEPAM 0.5 MG PO TABS: ORAL_TABLET | ORAL | 4 refills | Status: AC

## 2024-11-22 NOTE — Telephone Encounter (Signed)
 Clonazepam refilled

## 2024-11-22 NOTE — Telephone Encounter (Signed)
Patient requesting refill on klonopin

## 2024-11-29 ENCOUNTER — Ambulatory Visit: Admitting: Internal Medicine

## 2024-11-29 ENCOUNTER — Encounter: Payer: Self-pay | Admitting: Internal Medicine

## 2024-11-29 VITALS — BP 130/72 | HR 83 | Ht 65.0 in | Wt 186.0 lb

## 2024-11-29 DIAGNOSIS — K219 Gastro-esophageal reflux disease without esophagitis: Secondary | ICD-10-CM | POA: Diagnosis not present

## 2024-11-29 DIAGNOSIS — K222 Esophageal obstruction: Secondary | ICD-10-CM

## 2024-11-29 MED ORDER — ESOMEPRAZOLE MAGNESIUM 40 MG PO CPDR: 40.0000 mg | DELAYED_RELEASE_CAPSULE | Freq: Two times a day (BID) | ORAL | 3 refills | Status: AC

## 2024-11-29 NOTE — Patient Instructions (Signed)
 We have sent the following medications to your pharmacy for you to pick up at your convenience: Nexium   _______________________________________________________  If your blood pressure at your visit was 140/90 or greater, please contact your primary care physician to follow up on this.  _______________________________________________________  If you are age 71 or older, your body mass index should be between 23-30. Your Body mass index is 30.95 kg/m. If this is out of the aforementioned range listed, please consider follow up with your Primary Care Provider.  If you are age 28 or younger, your body mass index should be between 19-25. Your Body mass index is 30.95 kg/m. If this is out of the aformentioned range listed, please consider follow up with your Primary Care Provider.   ________________________________________________________  The [facility] providers would like to encourage you to use MYCHART to communicate with providers for non-urgent requests or questions.  Due to long hold times on the telephone, sending your provider a message by [facility] may be a faster and more efficient way to get a response.  Please allow 48 business hours for a response.  Please remember that this is for non-urgent requests.  _______________________________________________________  [facility] is using a team-based approach to care.  Your team is made up of your doctor and two to three APPS. Our APPS (Nurse Practitioners and Physician Assistants) work with your physician to ensure care continuity for you. They are fully qualified to address your health concerns and develop a treatment plan. They communicate directly with your gastroenterologist to care for you. Seeing the Advanced Practice Practitioners on your physician's team can help you by facilitating care more promptly, often allowing for earlier appointments, access to diagnostic testing, procedures, and other specialty referrals.   I  appreciate the opportunity to care for you. Lupita Commander, MD, [facility]

## 2024-11-29 NOTE — Progress Notes (Signed)
 Misty [name] 73 y.o. 05-Oct-1951 [id number]  Assessment & Plan:   Encounter Diagnoses  Name Primary?   Esophageal stricture Yes   Gastroesophageal reflux disease, unspecified whether esophagitis present     Rare pill sticking due to insufficient water intake. Dilation January 2024 was effective. - Continue current management.  Gastroesophageal reflux disease (GERD) GERD controlled with Nexium . No current symptoms. Discussed unproven risks of long-term Nexium  use. Benefits outweigh potential risks. - Refilled Nexium  prescription through [facility]. - Continue Nexium  as needed, typically once daily, with occasional twice daily dosing.    RTC 2 yrears or refill at colonoscopy  Subjective:   Chief Complaint: GERD history of esophageal stricture  HPI Discussed the use of AI scribe software for clinical note transcription with the patient, who gave verbal consent to proceed.  History of Present Illness   Misty [name] is a 73 year old female with GERD and esophageal stricture who presents for a medication refill.  Esophageal stricture and dysphagia - History of esophageal stricture identified on prior barium swallow, proximal esophageal stricture, with prominent cricopharyngeal muscle impression and upper cervical esophageal stenosis. - Underwent esophageal dilation procedure in the past January 2024, cervical esophageal stricture dilated with 54 French Maloney dilator and dilating affect seen.  Also had fundic gland polyps. - No longer experiences pills getting stuck; rarely has issues with swallowing - Occasionally feels a pill 'get stuck' if insufficient water is taken, causing a burning sensation in the throat  Gastroesophageal reflux disease (gerd) symptoms and management - Chronic GERD managed with Nexium  (esomeprazole ), typically taken once daily, occasionally twice daily - Nexium  is effective in preventing acid reflux symptoms - Reflux symptoms occur  rarely, usually only if a dietary trigger is consumed and medication is missed - Rarely forgets to take Nexium   Medication administration and triggers - Takes Nexium  upon waking in the morning sometimes twice daily usually daily - Drinks two cups of weak coffee daily - Acid reflux symptoms are triggered by certain foods if Nexium  is not taken        Wt Readings from Last 3 Encounters:  11/29/24 186 lb (84.4 kg)  10/22/24 181 lb (82.1 kg)  04/26/24 189 lb 6.4 oz (85.9 kg)    Allergies  Allergen Reactions   Dust Mite Extract    Latex Hives   Macrobid  [Nitrofurantoin Macrocrystal]    Molds & Smuts    Current Meds  Medication Sig   albuterol (VENTOLIN HFA) 108 (90 Base) MCG/ACT inhaler INHALE 1 TO 2 PUFFS BY MOUTH EVERY 4 TO 6 HOURS AS NEEDED   Cholecalciferol (VITAMIN D) 125 MCG (5000 UT) CAPS Take 5,000 Units by mouth daily.   clonazePAM  (KLONOPIN ) 0.5 MG tablet 1 to 3 tabs for sleep as needed   esomeprazole  (NEXIUM ) 40 MG capsule Take 1 capsule (40 mg total) by mouth 2 (two) times daily before a meal.   ezetimibe (ZETIA) 10 MG tablet Take 10 mg by mouth daily.   Galcanezumab -gnlm (EMGALITY ) 120 MG/ML SOSY Inject by subcutaneous route. (Patient taking differently: as needed.)   Magnesium  Gluconate 550 MG TABS Take by mouth.   nortriptyline (PAMELOR) 50 MG capsule Take 50 mg by mouth at bedtime.   rizatriptan  (MAXALT -MLT) 10 MG disintegrating tablet Take 1 tablet (10 mg total) by mouth as needed for migraine. May repeat in 2 hours if needed, max 2 tabs / 24 hours   verapamil (VERELAN PM) 120 MG 24 hr capsule Take 120 mg by mouth 2 (two)  times daily.   vitamin B-12 (CYANOCOBALAMIN ) 500 MCG tablet Take by mouth.   Past Medical History:  Diagnosis Date   Allergy    Anxiety    Asthma    allergic asthma due to allergies per pt   Depression    Fundic gland polyps of stomach, benign    GERD (gastroesophageal reflux disease)    Headache    Hiatal hernia    Hypercholesteremia     Insomnia    Osteopenia    Plantar fascia syndrome    Scoliosis    chronic pain   Sleep apnea    wears cpap   Past Surgical History:  Procedure Laterality Date   COLONOSCOPY     excision of morton's neuroma  1986   MYOMECTOMY  1996   NASAL SEPTOPLASTY W/ TURBINOPLASTY N/A 03/25/2022   Procedure: NASAL SEPTOPLASTY WITH TURBINATE REDUCTION;  Surgeon: Karis Clunes, MD;  Location: [facility];  Service: ENT;  Laterality: N/A;   POLYPECTOMY     SIGMOIDOSCOPY     TONSILLECTOMY  1966   Social History   Social History Narrative   Retired Dealer, married   No  alcohol  or drug use never smoker   Right Handed   2 cups of Coffee per Day   family history includes Aneurysm in her father; CAD in her father; Dementia in her mother; Glaucoma in her mother; Heart disease in her father; Hyperlipidemia in her father; Hypertension in her father and mother; Lupus in her father; Rheum arthritis in her father; Stroke in her maternal grandfather.   Review of Systems As per HPI  Objective:   Physical Exam BP 130/72   Pulse 83   Ht 5' 5 (1.651 m)   Wt 186 lb (84.4 kg)   LMP  (LMP Unknown)   SpO2 96%   BMI 30.95 kg/m

## 2024-12-01 ENCOUNTER — Telehealth: Payer: Self-pay

## 2024-12-01 ENCOUNTER — Other Ambulatory Visit (HOSPITAL_COMMUNITY): Payer: Self-pay

## 2024-12-01 NOTE — Telephone Encounter (Signed)
 Pharmacy Patient Advocate Encounter   Received notification from Patient Pharmacy that prior authorization for Esomeprazole  Magnesium  40MG  dr capsules is required/requested.   Insurance verification completed.   The patient is insured through [employer].   Per test claim: PA required; PA submitted to above mentioned insurance via Latent Key/confirmation #/EOC [id number] Status is pending

## 2024-12-02 NOTE — Telephone Encounter (Signed)
 Pharmacy Patient Advocate Encounter  Received notification from [facility] [employer] that Prior Authorization for Esomeprazole  Magnesium  40MG  dr capsules has been APPROVED from 12-01-2024 to 12-01-2025   PA #/Case ID/Reference #: [id number]

## 2024-12-03 DIAGNOSIS — E7849 Other hyperlipidemia: Secondary | ICD-10-CM | POA: Diagnosis not present

## 2024-12-16 ENCOUNTER — Ambulatory Visit: Admitting: Internal Medicine

## 2024-12-16 ENCOUNTER — Encounter: Payer: Self-pay | Admitting: Family Medicine

## 2024-12-17 NOTE — Telephone Encounter (Signed)
 Forwarding to Dr. Denyse Amass.

## 2025-04-18 ENCOUNTER — Encounter

## 2025-04-26 ENCOUNTER — Ambulatory Visit: Admitting: Internal Medicine
